# Patient Record
Sex: Female | Born: 1985 | Hispanic: Yes | Marital: Married | Smoking: Never smoker
Health system: Southern US, Community
[De-identification: ages and names within clinical notes are randomized; demographics above are authoritative.]

## PROBLEM LIST (undated history)

## (undated) HISTORY — PX: DILATION AND CURETTAGE OF UTERUS: SHX78

---

## 2012-11-26 ENCOUNTER — Ambulatory Visit: Payer: Self-pay | Admitting: Advanced Practice Midwife

## 2012-11-30 ENCOUNTER — Ambulatory Visit: Payer: Self-pay | Admitting: Obstetrics and Gynecology

## 2012-11-30 LAB — HEMOGLOBIN: HGB: 12 g/dL (ref 12.0–16.0)

## 2012-12-01 LAB — PATHOLOGY REPORT

## 2013-07-01 ENCOUNTER — Ambulatory Visit: Payer: Self-pay | Admitting: Advanced Practice Midwife

## 2013-11-20 ENCOUNTER — Observation Stay: Payer: Self-pay

## 2013-11-22 ENCOUNTER — Inpatient Hospital Stay: Payer: Self-pay | Admitting: Obstetrics and Gynecology

## 2013-11-22 LAB — CBC WITH DIFFERENTIAL/PLATELET
Basophil #: 0 10*3/uL (ref 0.0–0.1)
Basophil %: 0.5 %
EOS PCT: 0.4 %
Eosinophil #: 0 10*3/uL (ref 0.0–0.7)
HCT: 38.1 % (ref 35.0–47.0)
HGB: 12.6 g/dL (ref 12.0–16.0)
LYMPHS ABS: 1.3 10*3/uL (ref 1.0–3.6)
LYMPHS PCT: 16.2 %
MCH: 29 pg (ref 26.0–34.0)
MCHC: 33 g/dL (ref 32.0–36.0)
MCV: 88 fL (ref 80–100)
MONOS PCT: 8.4 %
Monocyte #: 0.7 x10 3/mm (ref 0.2–0.9)
NEUTROS ABS: 6.2 10*3/uL (ref 1.4–6.5)
Neutrophil %: 74.5 %
Platelet: 260 10*3/uL (ref 150–440)
RBC: 4.33 10*6/uL (ref 3.80–5.20)
RDW: 14.5 % (ref 11.5–14.5)
WBC: 8.3 10*3/uL (ref 3.6–11.0)

## 2013-11-22 LAB — GC/CHLAMYDIA PROBE AMP

## 2013-11-24 LAB — HEMATOCRIT: HCT: 28.2 % — ABNORMAL LOW (ref 35.0–47.0)

## 2014-04-13 IMAGING — US US OB LIMITED
1 series · 14 of 28 positions shown · non-contrast
Comparison: none

REASON FOR EXAM: CALL REPORT [id number] Fetal Heart Tones at 16.5 Wks
COMMENTS:

[Series 1: us ob limited · 0.30mm/px · 14 of 36 slices shown]
[im 2/36]
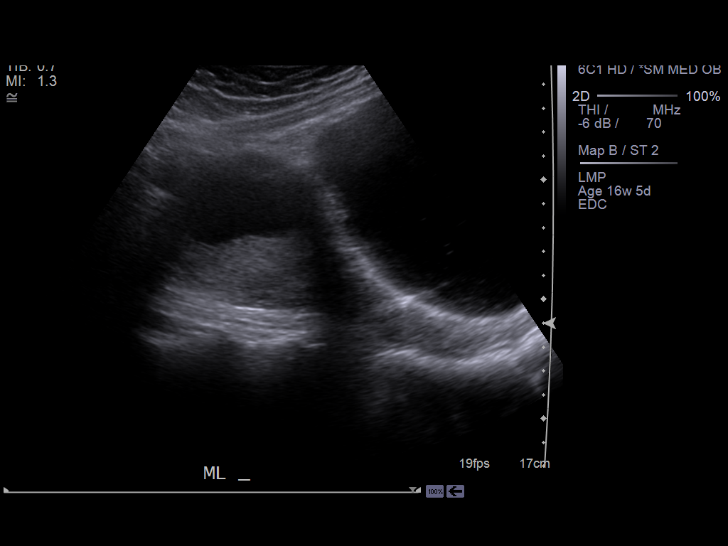
[im 4/36]
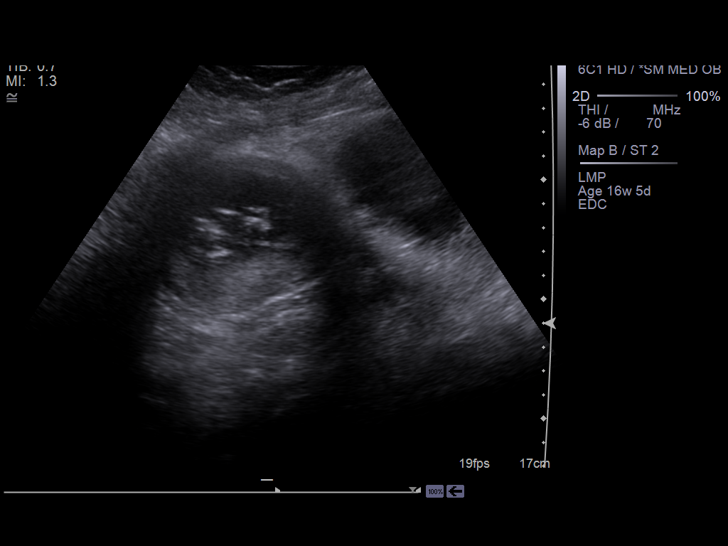
[im 7/36]
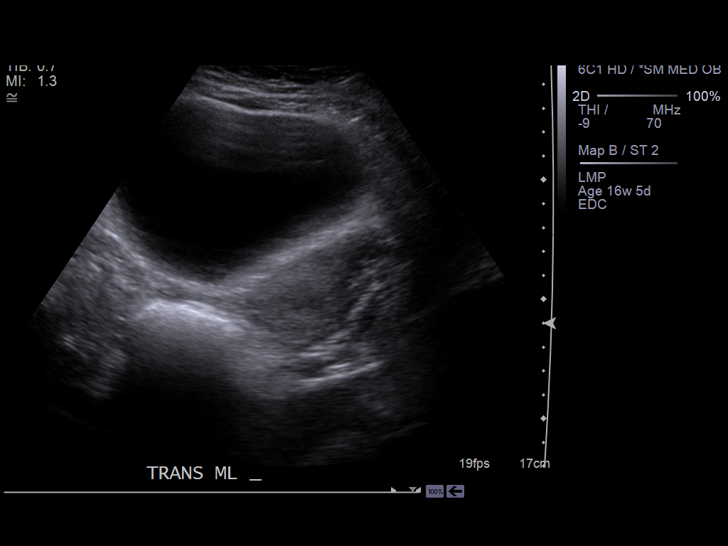
[im 10/36]
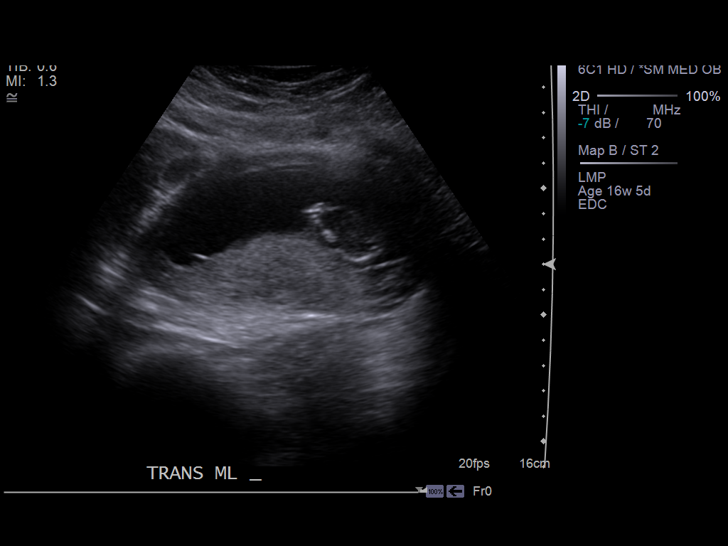
[im 12/36]
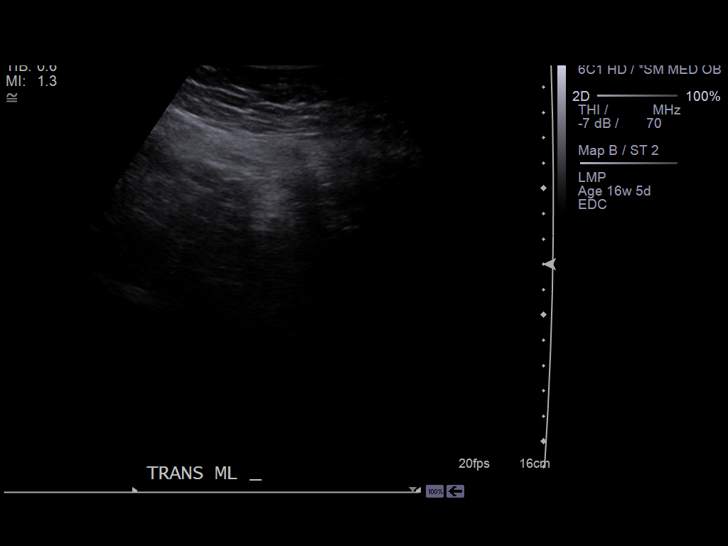
[im 15/36]
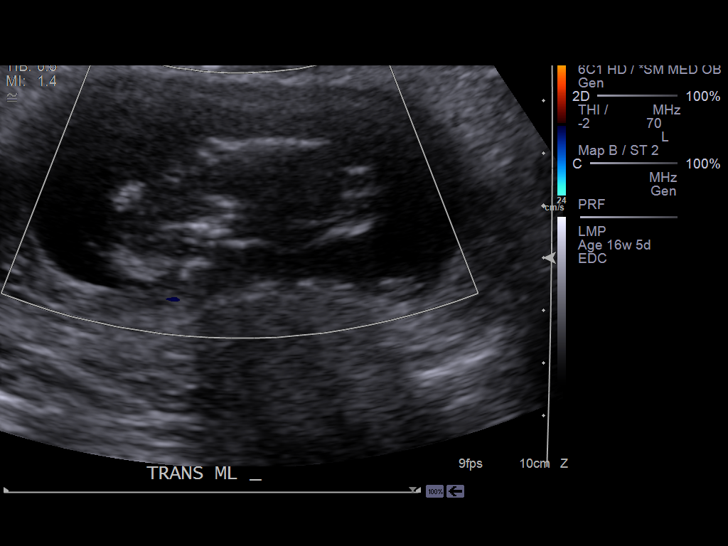
[im 17/36]
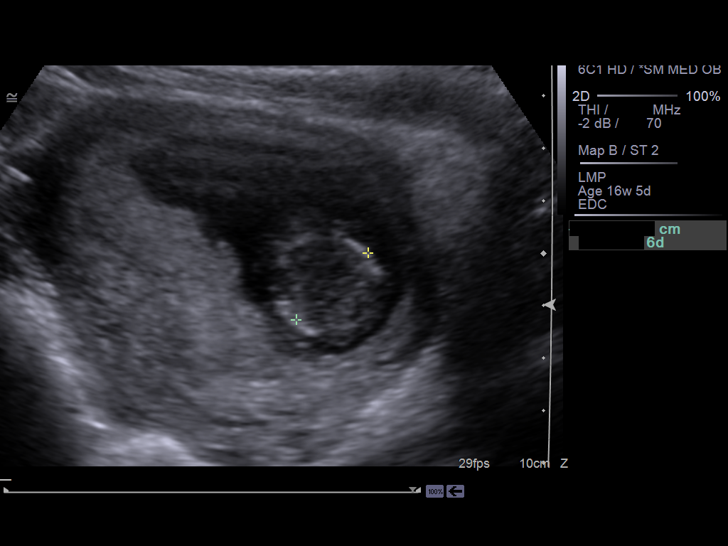
[im 20/36]
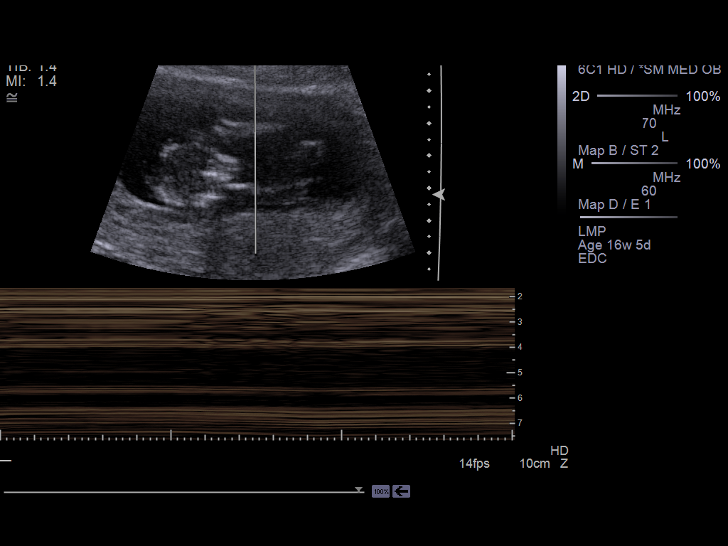
[im 23/36]
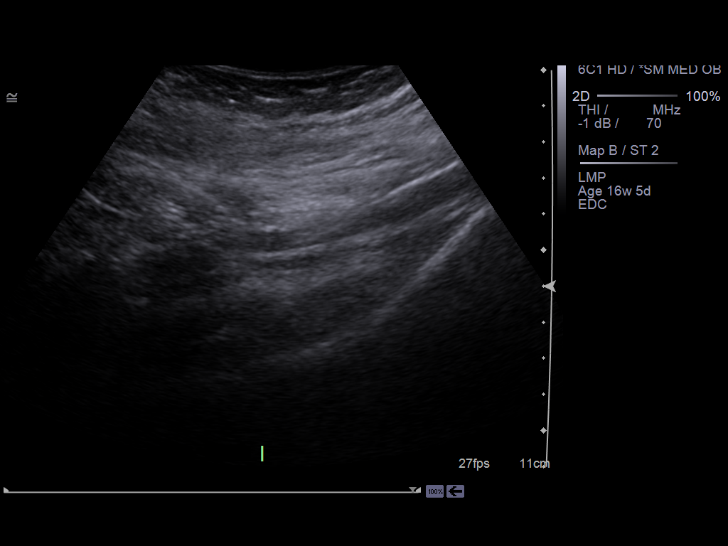
[im 25/36]
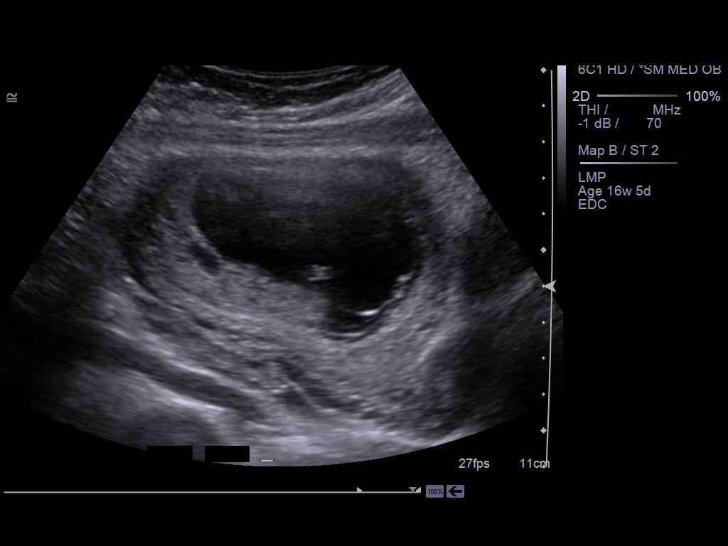
[im 28/36]
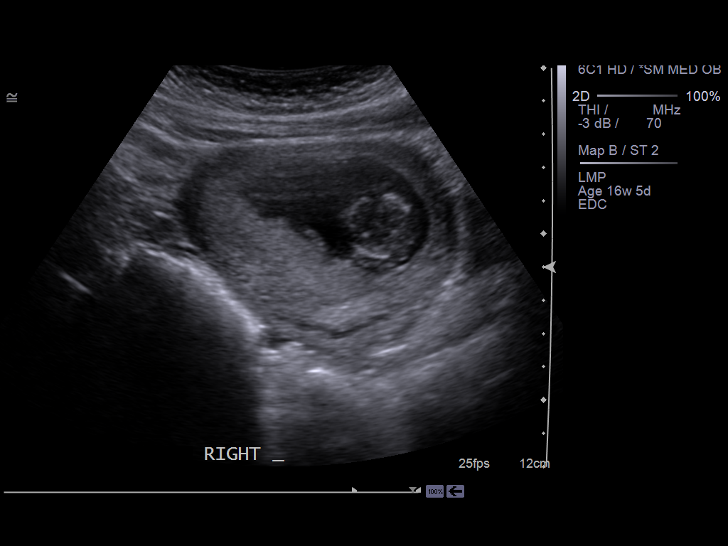
[im 30/36]
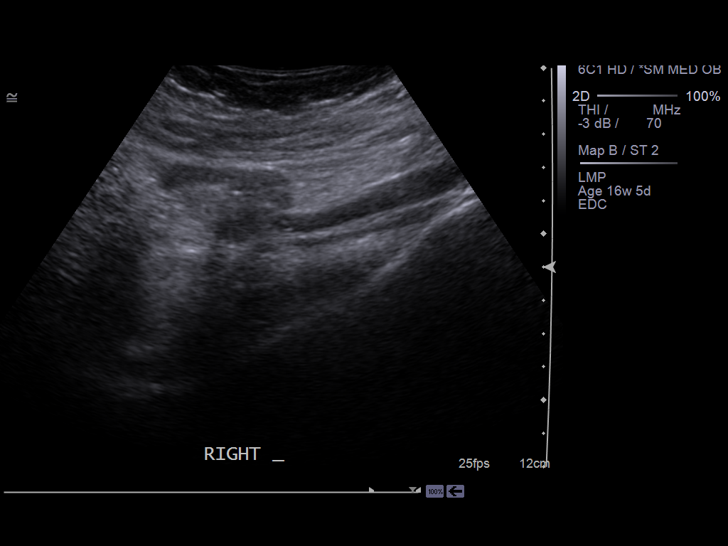
[im 33/36]
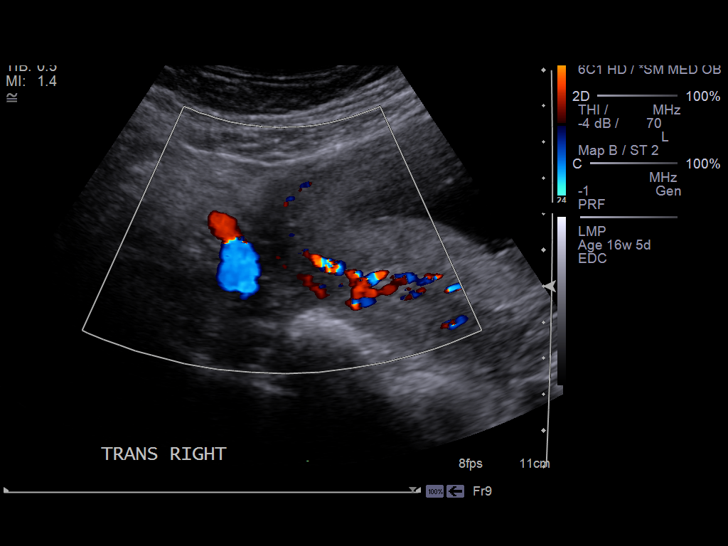
[im 36/36]
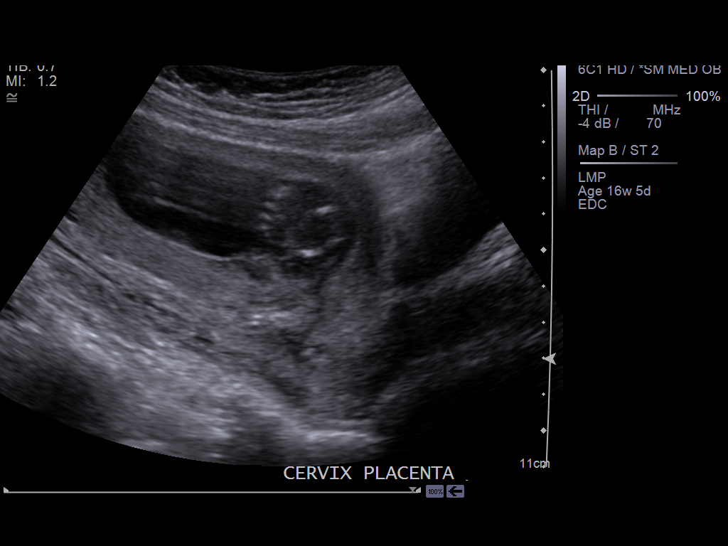

[14 of 28 positions shown; findings below may reference images not displayed]

PROCEDURE:     US  - US LIMITED OB  - November 26, 2012  [DATE]

RESULT:     There is a gravid uterus present. The presentation of the fetus
is currently transverse. No fetal cardiac activity is demonstrated. The
crown-rump length measures 5.52 cm corresponding to 12 week 1 day gestation
with the BPD measuring 1.7 cm which would correspond to 12 week 6 day
gestation.

The placenta is posterior, partially fundal, and the lower placental segment
comes to lie within 1 cm of the internal cervical os.
IMPRESSION: 1. The findings are consistent with fetal demise.
2. The lower placental segment is very low lying.

A preliminary report was called to Dr. Geulma at [DATE] p.m. on November 26, 2012.

[REDACTED]

## 2014-11-17 NOTE — Op Note (Signed)
PATIENT NAME:  Cheryl FickleCARMONA FRIAS, Phyillis MR#:  [id number] DATE OF BIRTH:  [birth year]  DATE OF PROCEDURE:  11/30/2012  PREOPERATIVE DIAGNOSIS: Missed abortion.   POSTOPERATIVE DIAGNOSIS: Missed abortion.   PROCEDURE: Suction dilation and curettage.   SURGEON: Jennell Cornerhomas Abryanna Musolino, M.D.   ANESTHESIA: General endotracheal anesthesia.   INDICATIONS: This is a 29 year old gravida 1, para 0 at 17 weeks with missed abortion documented by ultrasound on 11/26/2012.  Blood type O-positive.  DESCRIPTION OF PROCEDURE: After adequate general endotracheal anesthesia, the patient was placed in the dorsal supine position with the legs in the candy-cane stirrups. The patient was prepped and draped in normal sterile fashion. Bladder was catheterized yielding 300 mL of clear urine. A weighted speculum was placed in the posterior vaginal vault and the anterior cervix was grasped with a single-tooth tenaculum. The cervix was dilated to a #20 Hanks dilator without difficulty. A #8 flexible suction curette placed into the endometrial cavity. Large amount of tissue, blood and fluid removed. Randall stone forceps removed additional large portion of placental tissue. The patient had brisk bleeding during the procedure. She did receive 0.2 mg intramuscular injection of Methergine and 250 mcg of Hemabate intramuscularly as well. At the end of the procedure, the patient's bleeding was controlled.  No active bleeding noted. The patient's vital signs stayed stable throughout the procedure.  Intraoperative fluids 500 mL. Estimated blood loss 800 mL.  The patient was taken to the recovery room in good condition.  ____________________________ Suzy Bouchardhomas J. Jeanclaude Wentworth, MD tjs:sb D: 11/30/2012 10:45:30 ET     T: 11/30/2012 10:51:35 ET       JOB#: [id number] cc: Suzy Bouchardhomas J. Ericha Whittingham, MD, <Dictator> Suzy BouchardHOMAS J Aviannah Castoro MD ELECTRONICALLY SIGNED 12/13/2012 13:55

## 2014-11-18 NOTE — Op Note (Signed)
PATIENT NAME:  Cheryl, Fernandez MR#:  [id number] DATE OF BIRTH:  1985-11-12  DATE OF PROCEDURE:  11/23/2013  PREOPERATIVE DIAGNOSES:  43.  A 29 year old gravida 2, P0-0-1-0 at 40 weeks, 2 days gestation.  2.  Premature rupture of membranes.  3.  Active phase arrest.  4.  Chorioamnionitis.    POSTOPERATIVE DIAGNOSES: 29.  A 29 year old gravida 2, P0-o-1-0 at 40 weeks, 2 days gestation.  2.  Premature rupture of membranes.  3.  Active phase arrest.  4.  Chorioamnionitis.  5.  Meconium.   PROCEDURE PERFORMED: Primary low transverse cesarean section with Pfannenstiel skin incision.   ANESTHESIA USED: Epidural.  PRIMARY SURGEON: Lorrene Reid, M.D.   ASSISTANT: Deatra Robinson, MD.   PREOPERATIVE ANTIBIOTICS: The patient was on ampicillin and gentamicin secondary to the diagnosis of chorioamnionitis. She had 900 mg of clindamycin added to this regimen at the time of C-section.   ESTIMATED BLOOD LOSS: 600 mL.  OPERATIVE FLUIDS: 700 mL of crystalloid.   URINE OUTPUT: 125 mL of clear urine.   COMPLICATIONS: None.   FINDINGS: Normal tubes, ovaries and uterus. Delivery resulted in the birth of a liveborn female infant weighing 3390 grams, 7 pounds, 8 ounces, Apgars 7 and 8.   SPECIMENS REMOVED: None.   COMPLICATIONS: None.   THE PATIENT CONDITION FOLLOWING PROCEDURE: Stable.   PROCEDURE IN DETAIL: The risks, benefits, and alternatives of the procedure were discussed with the patient prior to proceeding to the operating room. The patient was taken to the operating room where epidural was dosed for a C-section. She was positioned in the supine position, prepped and draped in the usual sterile fashion. A timeout procedure was performed. A Pfannenstiel skin incision was made 2 cm above the pubic symphysis and carried down sharply to the level of the rectus fascia. The fascia was incised sharply using the scalpel and then extended using Mayo scissors. The superior border of the rectus  fascia was grasped with 2 Kocher clamps. The underlying rectus muscles were dissected off the fascia bluntly. The median raphe was incised using Mayo scissors. The inferior border of the rectus fascia was dissected off the rectus muscle in a similar fashion. The midline was identified. The peritoneum was entered bluntly and the peritoneal incision extended using manual traction. A bladder blade was then placed. A low transverse incision was made on the uterus and the hysterotomy incision was entered bluntly using the operator's finger. Hysterotomy incision was then extended using manual traction. Light meconium was noted upon entry into the uterus. The operator's hand was placed into the hysterotomy incision. The fetus was noted in the OA position. The vertex was grasped, flexed, brought to the incision and the infant was delivered atraumatically using fundal pressure. Cord was clamped and cut. The infant was then passed to the awaiting pediatricians.   Placenta was delivered using manual extraction. The uterus was exteriorized and wiped clean of clots and debris using moist laps. The hysterotomy incision was then closed using a 2 layer closure of 0 Vicryl with the first being a running locked, the second a horizontal imbricating. Following uterine closure, the uterus was returned to the abdomen. The peritoneal gutters were wiped clean of clots and debris using 2 moist laps. The hysterotomy incision was reinspected and noted to be hemostatic. The rectus muscles were reapproximated in the midline using a 2-0 Vicryl mattress stitch. Following this, the On-Q catheters were placed subfascially per the usual protocol. The fascia was closed using a looped #1  PDS in a running fashion. Subcutaneous tissue was irrigated and hemostasis achieved using the Bovie. The skin was closed using a 4-0 Monocryl in a subcuticular fashion. Each On-Q catheter was dressed with Dermabond as was the incision. Each On-Q catheter was bolused  with 5 mL of 0.5% bupivacaine each. Sponge, needle, and instrument counts were correct x 2. The patient tolerated the procedure well and was taken to the recovery room in stable condition.   ____________________________ Florina OuAndreas M. Bonney AidStaebler, MD ams:aw D: 11/23/2013 07:37:00 ET T: 11/23/2013 07:59:19 ET JOB#: [id number]  cc: Florina OuAndreas M. Bonney AidStaebler, MD, <Dictator> Lorrene ReidANDREAS M Aldous Housel MD ELECTRONICALLY SIGNED 12/04/2013 11:29

## 2014-12-03 ENCOUNTER — Emergency Department: Payer: Medicaid Other

## 2014-12-03 ENCOUNTER — Emergency Department
Admission: EM | Admit: 2014-12-03 | Discharge: 2014-12-03 | Disposition: A | Discharge status: Home / Self Care | Payer: Medicaid Other | Attending: Emergency Medicine | Admitting: Emergency Medicine

## 2014-12-03 ENCOUNTER — Encounter: Payer: Self-pay | Admitting: *Deleted

## 2014-12-03 DIAGNOSIS — Z3A08 8 weeks gestation of pregnancy: Secondary | ICD-10-CM | POA: Insufficient documentation

## 2014-12-03 DIAGNOSIS — O418X10 Other specified disorders of amniotic fluid and membranes, first trimester, not applicable or unspecified: Secondary | ICD-10-CM | POA: Diagnosis not present

## 2014-12-03 DIAGNOSIS — O468X1 Other antepartum hemorrhage, first trimester: Secondary | ICD-10-CM

## 2014-12-03 DIAGNOSIS — O9989 Other specified diseases and conditions complicating pregnancy, childbirth and the puerperium: Secondary | ICD-10-CM | POA: Diagnosis present

## 2014-12-03 DIAGNOSIS — Z79899 Other long term (current) drug therapy: Secondary | ICD-10-CM | POA: Diagnosis not present

## 2014-12-03 DIAGNOSIS — O26899 Other specified pregnancy related conditions, unspecified trimester: Secondary | ICD-10-CM

## 2014-12-03 DIAGNOSIS — O2341 Unspecified infection of urinary tract in pregnancy, first trimester: Secondary | ICD-10-CM | POA: Insufficient documentation

## 2014-12-03 DIAGNOSIS — R109 Unspecified abdominal pain: Secondary | ICD-10-CM

## 2014-12-03 LAB — COMPREHENSIVE METABOLIC PANEL
ALBUMIN: 3.9 g/dL (ref 3.5–5.0)
ALK PHOS: 70 U/L (ref 38–126)
ALT: 12 U/L — AB (ref 14–54)
AST: 15 U/L (ref 15–41)
Anion gap: 8 (ref 5–15)
BUN: 13 mg/dL (ref 6–20)
CALCIUM: 9.2 mg/dL (ref 8.9–10.3)
CO2: 22 mmol/L (ref 22–32)
Chloride: 107 mmol/L (ref 101–111)
Creatinine, Ser: 0.54 mg/dL (ref 0.44–1.00)
GFR calc non Af Amer: >60 mL/min (ref >60)
Glucose, Bld: 107 mg/dL — ABNORMAL HIGH (ref 65–99)
Potassium: 3.8 mmol/L (ref 3.5–5.1)
SODIUM: 137 mmol/L (ref 135–145)
TOTAL PROTEIN: 7.6 g/dL (ref 6.5–8.1)
Total Bilirubin: 0.3 mg/dL (ref 0.3–1.2)

## 2014-12-03 LAB — LIPASE, BLOOD: LIPASE: 31 U/L (ref 22–51)

## 2014-12-03 LAB — URINALYSIS COMPLETE WITH MICROSCOPIC (ARMC ONLY)
Bilirubin Urine: NEGATIVE
GLUCOSE, UA: NEGATIVE mg/dL
Ketones, ur: NEGATIVE mg/dL
NITRITE: NEGATIVE
Protein, ur: NEGATIVE mg/dL
SPECIFIC GRAVITY, URINE: 1.008 (ref 1.005–1.030)
pH: 7 (ref 5.0–8.0)

## 2014-12-03 LAB — CBC WITH DIFFERENTIAL/PLATELET
BASOS PCT: 0 %
Basophils Absolute: 0 10*3/uL (ref 0–0.1)
Eosinophils Absolute: 0.1 10*3/uL (ref 0–0.7)
Eosinophils Relative: 1 %
HEMATOCRIT: 38.2 % (ref 35.0–47.0)
HEMOGLOBIN: 12.8 g/dL (ref 12.0–16.0)
Lymphocytes Relative: 19 %
Lymphs Abs: 2.5 10*3/uL (ref 1.0–3.6)
MCH: 29.2 pg (ref 26.0–34.0)
MCHC: 33.6 g/dL (ref 32.0–36.0)
MCV: 86.9 fL (ref 80.0–100.0)
MONOS PCT: 6 %
Monocytes Absolute: 0.8 10*3/uL (ref 0.2–0.9)
NEUTROS ABS: 9.6 10*3/uL — AB (ref 1.4–6.5)
Neutrophils Relative %: 74 %
Platelets: 310 10*3/uL (ref 150–440)
RBC: 4.39 MIL/uL (ref 3.80–5.20)
RDW: 13.3 % (ref 11.5–14.5)
WBC: 13.1 10*3/uL — ABNORMAL HIGH (ref 3.6–11.0)

## 2014-12-03 LAB — HCG, QUANTITATIVE, PREGNANCY: HCG, BETA CHAIN, QUANT, S: 101107 m[IU]/mL — AB (ref <5)

## 2014-12-03 MED ORDER — CEPHALEXIN 500 MG PO CAPS
ORAL_CAPSULE | ORAL | Status: AC
  Administered 2014-12-03: 500 mg via ORAL
  Filled 2014-12-03: qty 1

## 2014-12-03 MED ORDER — ONDANSETRON 4 MG PO TBDP
4.0000 mg | ORAL_TABLET | Freq: Once | ORAL | Status: AC
  Administered 2014-12-03: 4 mg via ORAL

## 2014-12-03 MED ORDER — ONDANSETRON 4 MG PO TBDP
ORAL_TABLET | ORAL | Status: AC
  Administered 2014-12-03: 4 mg via ORAL
  Filled 2014-12-03: qty 1

## 2014-12-03 MED ORDER — CEPHALEXIN 500 MG PO CAPS
500.0000 mg | ORAL_CAPSULE | Freq: Once | ORAL | Status: AC
  Administered 2014-12-03: 500 mg via ORAL

## 2014-12-03 MED ORDER — CEPHALEXIN 500 MG PO CAPS: 500.0000 mg | ORAL_CAPSULE | Freq: Three times a day (TID) | ORAL | Status: AC

## 2014-12-03 NOTE — ED Notes (Signed)
Discharge completed and all questions answered. Pt aware of how to take meds and who to follow up with. Interpreter was at bedside.

## 2014-12-03 NOTE — Discharge Instructions (Signed)
Dolor abdominal durante el embarazo °(Abdominal Pain During Pregnancy) °El dolor de vientre (abdominal) es habitual durante el embarazo. Generalmente no se trata de un problema grave. Otras veces puede ser un signo de que algo no anda bien. Siempre comuníquese con su médico si tiene dolor abdominal. °CUIDADOS EN EL HOGAR °Controle el dolor para ver si hay cambios. Las indicaciones que siguen pueden ayudarla a sentirse mejor: °· Notenga sexo (relaciones sexuales) ni se coloque nada dentro de la vagina hasta que se sienta mejor. °· Haga reposo hasta que el dolor se calme. °· Si siente ganas de vomitar (náuseas ) beba líquidos claros. No consuma alimentos sólidos hasta que se sienta mejor. °· Sólo tome los medicamentos que le haya indicado su médico. °· Cumpla con las visitas al médico según las indicaciones. °SOLICITE AYUDA DE INMEDIATO SI:  °· Tiene un sangrado, pierde líquido o elimina trozos de tejido por la vagina. °· Siente más dolor o cólicos. °· Comienza a vomitar. °· Siente dolor al orinar u observa sangre en la orina. °· Tiene fiebre. °· No siente que el bebé se mueva mucho. °· Se siente muy débil o cree que va a desmayarse. °· Tiene dificultad para respirar con o sin dolor en el vientre. °· Siente un dolor de cabeza muy intenso y dolor en el vientre. °· Observa que sale un líquido por la vagina y tiene dolor abdominal. °· La materia fecal es líquida (diarrea). °· El dolor en el viente no desaparece, o empeora, luego de hacer reposo. °ASEGÚRESE DE QUE:  °· Comprende estas instrucciones. °· Controlará su afección. °· Recibirá ayuda de inmediato si no mejora o si empeora. °Document Released: 03/26/2011 Document Revised: 03/16/2013 °Exit[employer]® Patient Information ©2015 [employer]. This information is not intended to replace advice given to you by your health care provider. Make sure you discuss any questions you have with your health care provider. ° °

## 2014-12-03 NOTE — ED Notes (Addendum)
Awaiting interpreter for discharge.  

## 2014-12-03 NOTE — ED Notes (Signed)
Pt states she woke with new onset abdominal pain. Vomiting x 1. Pt denies dysuria. Pt denies vaginal bleeding or fluid leaking. Pt is [redacted] weeks pregnant.

## 2014-12-03 NOTE — ED Provider Notes (Addendum)
[facility] Emergency Department Provider Note  ____________________________________________  Time seen: Approximately 9:29 AM  I have reviewed the triage vital signs and the nursing notes.   HISTORY  Chief Complaint Abdominal Pain    HPI Oluchi Herma ArdCarmona Frias is a 29 y.o. female who is a G3 P1 presents today [redacted] weeks pregnant with lower abdominal pain. She says that this woke her up at 2 AM. It was across her lower abdomen and cramping. She says at this point the pain has been completely relieved. She is concerned because she had a miscarriage of her first pregnancy. She denies any vaginal bleeding. However, she was seen at the health department where she is getting her OB/GYN care this past Thursday and was put on a vaginal cream, presumably for bacterial vaginosis. Does report nausea and vomiting with pregnancy which is not new with this pain. No dysuria at this time.   History reviewed. No pertinent past medical history.  There are no active problems to display for this patient.   Past Surgical History  Procedure Laterality Date  . Cesarean section      Current Outpatient Rx  Name  Route  Sig  Dispense  Refill  . Prenatal Vit-Fe Fumarate-FA (PRENATAL MULTIVITAMIN) TABS tablet   Oral   Take 1 tablet by mouth at bedtime.            Allergies Review of patient's allergies indicates no known allergies.  No family history on file.  Social History History  Substance Use Topics  . Smoking status: Never Smoker   . Smokeless tobacco: Never Used  . Alcohol Use: No    Review of Systems Constitutional: No fever/chills Eyes: No visual changes. ENT: No sore throat. Cardiovascular: Denies chest pain. Respiratory: Denies shortness of breath. Gastrointestinal: No abdominal pain. Abdominal pain is resolved..    No diarrhea.  No constipation. Genitourinary: Negative for dysuria. Musculoskeletal: Negative for back pain. Skin: Negative for  rash. Neurological: Negative for headaches, focal weakness or numbness.  10-point ROS otherwise negative.  ____________________________________________   PHYSICAL EXAM:  VITAL SIGNS: ED Triage Vitals  Enc Vitals Group     BP 12/03/14 0244 130/72 mmHg     Pulse Rate 12/03/14 0244 90     Resp 12/03/14 0244 16     Temp 12/03/14 0244 97.8 F (36.6 C)     Temp Source 12/03/14 0244 Oral     SpO2 12/03/14 0244 100 %     Weight 12/03/14 0244 162 lb (73.483 kg)     Height 12/03/14 0244 5' 8.11" (1.73 m)     Head Cir --      Peak Flow --      Pain Score --      Pain Loc --      Pain Edu? --      Excl. in GC? --     Constitutional: Alert and oriented. Well appearing and in no acute distress. Eyes: Conjunctivae are normal. PERRL. EOMI. Head: Atraumatic. Nose: No congestion/rhinnorhea. Mouth/Throat: Mucous membranes are moist.  Oropharynx non-erythematous. Neck: No stridor.   Cardiovascular: Normal rate, regular rhythm. Grossly normal heart sounds.  Good peripheral circulation. Respiratory: Normal respiratory effort.  No retractions. Lungs CTAB. Gastrointestinal: Soft and nontender. No distention. No abdominal bruits. No CVA tenderness. Genitourinary:  Speculum exam deferred because of recent exam done this Thursday. Bimanual exam with closed cervix. No CMT or uterine or adnexal tenderness palpation.  Musculoskeletal: No lower extremity tenderness nor edema.  No joint effusions.  Neurologic:  Normal speech and language. No gross focal neurologic deficits are appreciated. Speech is normal. No gait instability. Skin:  Skin is warm, dry and intact. No rash noted. Psychiatric: Mood and affect are normal. Speech and behavior are normal.  ____________________________________________   LABS (all labs ordered are listed, but only abnormal results are displayed)  Labs Reviewed  CBC WITH DIFFERENTIAL/PLATELET - Abnormal; Notable for the following:    WBC 13.1 (*)    Neutro Abs 9.6 (*)     All other components within normal limits  COMPREHENSIVE METABOLIC PANEL - Abnormal; Notable for the following:    Glucose, Bld 107 (*)    ALT 12 (*)    All other components within normal limits  HCG, QUANTITATIVE, PREGNANCY - Abnormal; Notable for the following:    hCG, Beta Chain, Quant, S 101107 (*)    All other components within normal limits  URINALYSIS COMPLETEWITH MICROSCOPIC ([facility])  - Abnormal; Notable for the following:    Color, Urine STRAW (*)    APPearance HAZY (*)    Hgb urine dipstick 2+ (*)    Leukocytes, UA 3+ (*)    Bacteria, UA RARE (*)    Squamous Epithelial / LPF 0-5 (*)    All other components within normal limits  LIPASE, BLOOD   ____________________________________________  EKG   ____________________________________________  RADIOLOGY  Single live IUP on ultrasound with small subchorionic hemorrhage. ____________________________________________   PROCEDURES    ____________________________________________   INITIAL IMPRESSION / ASSESSMENT AND PLAN / ED COURSE  Pertinent labs & imaging results that were available during my care of the patient were reviewed by me and considered in my medical decision making (see chart for details).  We'll treat patient for UTI. Has follow-up with the health department. Patient reports that she is O+. Will not require any RhoGAM. Patient is taking prenatal vitamins at home and will continue this. We'll treat her for her UTI. Discussed the subchorionic hemorrhage with the patient and her husband. Patient knows to be aware that this may be an increasing bleed and may result in further bleeding and possible miscarriage or may resolve completely. ____________________________________________   FINAL CLINICAL IMPRESSION(S) / ED DIAGNOSES  Abdominal pain and pregnancy. Subchorionic hemorrhage. Urinary tract infection. Acute common initial visit.    Arelia Longestavid M Ayson Cherubini, MD 12/03/14 [phone]  Only white blood cell  count likely secondary to pregnancy. Unlikely appendicitis no abdominal pain on the time of exam.  Arelia Longestavid M Chery Giusto, MD 12/03/14 1011

## 2014-12-03 NOTE — ED Notes (Signed)
Patient transported to Ultrasound, by ultrasound tech.

## 2014-12-03 NOTE — ED Notes (Signed)
Pt dressed and sitting at bedside in nad. Family at bedside.

## 2014-12-03 NOTE — ED Notes (Signed)
Interpreter request entered

## 2014-12-03 NOTE — ED Notes (Signed)
Pt resting in bed in nad at this time. Pt has family at bedside. Pt awaits results and dispo. Pt free of needs or complainty at this time

## 2014-12-03 NOTE — ED Notes (Signed)
Patient is resting comfortably in bed waiting for ultrasound at this time.

## 2014-12-05 NOTE — H&P (Signed)
L&D Evaluation:  History:  HPI 29 yo G2P1001 with LMP of 02/14/13 & EDD of 11/21/13 from [facility] signficant for Tdap administration, LOF since Sunday with proven ROM today at [facility]. [place] [facility] [place] [facility] but, was neg for nitrazine and no fluid on a pad noted. Pt states she was leaking and sought eval here and was sent home and continued to leak clear fluid. Pt has a normal craving for ETOH but, none in pregnancy. No unitC,No VB, no cramping.   Presents with leaking fluid   Patient's Medical History Stress, Anxiety, UTI, spider veins, torn ligaments Rt arm,   Patient's Surgical History D&C, molar extraction   Medications Pre Natal Vitamins   Allergies NKDA   Social History EtOH  craves beer, none in pregnancy   ROS:  ROS All systems were reviewed.  HEENT, CNS, GI, GU, Respiratory, CV, Renal and Musculoskeletal systems were found to be normal.   Exam:  Vital Signs stable   General no apparent distress   Mental Status clear   Chest clear   Heart normal sinus rhythm, no murmur/gallop/rubs   Abdomen gravid, non-tender   Estimated Fetal Weight Average for gestational age   Fetal Position vtx   Back no CVAT   Reflexes 1+   Clonus negative   Pelvic 1.5/80%/vtx-1   Mebranes Intact   FHT normal rate with no decels   Ucx irregular   Skin dry   Lymph no lymphadenopathy   Impression:  Impression IUP at 40 1/7 weeks with SROM   Plan:  Plan monitor contractions and for cervical change, Pitocin per protocol   Comments Disc with Interpreter the risks, benefits and alternatives of Pitocin and IOL including fetal distress and uterine distress. Risks of infection, bleeding and the need to monitor for fetal and uterine complications. Blood consent signed and pt understood consent.   Electronic Signatures: Sharee PimpleJones, Caron W (CNM)  (Signed 28-Apr-15 13:00)  Authored: L&D Evaluation   Last Updated: 28-Apr-15 13:00 by Sharee PimpleJones, Caron W (CNM)

## 2015-06-27 NOTE — H&P (Signed)
OB DELIVERY PLAN VISIT  LMP: March 4 EDD: Dec 7   Subjective:    Cheryl Fernandez is a 29 y.o. female 63P1011 @ 39wks who presents for repeat c/s.    APC: [facility] 1. Previous C/S, desires repeat - 10/2013 @ 40+2 [facility] after failed IOL for PROM - FTP/Fetal distress/Chorio - s/p tdap 04/10/15 - s/p flu 11/30/14  Labs: 5.2016 - O+, Hbsag neg, h/h 12.5/37.2<317, GC/CT neg, HIV neg, Ab neg 9.14.16 - GCT 118, RPR NR, HIV neg, hb 11.5 2014 - RI, VZV immune  Sonos: per patient normal 19wk anatomy sono normal  Gynecologic History Patient's last menstrual period was 09/29/2014. Contraception prior to pregnancy: abstinence and natural family planning (NFP) Sexually active: yes Desires STD screening: no No h/o STDs Last Pap: 2014. Results were: normal   Obstetric History                      OB History  Gravida Para Term Preterm AB SAB TAB Ectopic Multiple Living  3 1 1  1 1    1     # Outcome Date GA Lbr Len/2nd Weight Sex Delivery Anes PTL Lv  3 Current           2 Term 11/23/13   3.402 kg (7 lb 8 oz) F CS-LTranv EPI  Y  Complications: Chorioamnionitis  1 SAB 11/30/12            s/p D&C after SAB  Past Medical History:  has no past medical history on file. None Problem List:  does not have a problem list on file. Past Surgical History:  has a past surgical history that includes Dilation and curettage of uterus (11/30/2012) and Cesarean section (11/23/2013). Family History: family history includes Diabetes mellitus in her mother; Hypertension in her father and mother. Social History:  reports that she has never smoked. She does not have any smokeless tobacco history on file. She reports that she does not drink alcohol or use illicit drugs. Current Medications: has a current medication list which includes the following prescription(s): prenatal vit no.124-iron-fa. Prior to encounter Medications:        Current Outpatient Prescriptions on  File Prior to Visit  Medication Sig Dispense Refill  . prenatal vit no.124-iron-FA (PRENATAL VITAMIN) 27 mg iron- 800 mcg Tab Take 1 tablet by mouth once daily.     No current facility-administered medications on file prior to visit.    Allergies: is allergic to tetanus vaccines and toxoid.  Review of Systems 14 systems reviewed pertinent positives and negatives as noted in the HPI and below.   Objective:       Vitals:   05/17/15 1020  BP: 110/78   General appearance: alert, appears stated age and cooperative Head: Normocephalic, without obvious abnormality, atraumatic Eyes: conjunctivae/corneas clear. PERRL, EOM's intact. Fundi benign. Sclera anicteric. Abdomen: soft, non-tender; bowel sounds normal; no masses, no organomegaly; pfannensteil incision Extremities: extremities normal, atraumatic, no cyanosis or edema Skin: Skin color, texture, turgor normal. No rashes or lesions Lymph nodes: Inguinal adenopathy: none    Assessment:    29 y.o. female G3P1011 @ 39wks here for repeat C/s.  Plan:    1. Previous C/S, plan for repeat - r/b/a of cesarean section discussed, including bleeding/pain/infection, injury to surrounding organs, wound separation, and need for blood transfusion. All questions answered. Consent forms signed for blood transfusion and repeat cesarean section.  2. Contraception - patient will think about forms of contraception but does not want a  BTL  Burnett Corrente, MD

## 2015-06-28 ENCOUNTER — Encounter
Admission: RE | Admit: 2015-06-28 | Discharge: 2015-06-28 | Disposition: A | Discharge status: Home / Self Care | Payer: Medicaid Other | Source: Ambulatory Visit | Attending: Obstetrics and Gynecology | Admitting: Obstetrics and Gynecology

## 2015-06-28 LAB — TYPE AND SCREEN
ABO/RH(D): O POS
Antibody Screen: NEGATIVE
Extend sample reason: UNDETERMINED

## 2015-06-28 LAB — CBC
HCT: 35.1 % (ref 35.0–47.0)
Hemoglobin: 11.6 g/dL — ABNORMAL LOW (ref 12.0–16.0)
MCH: 27.3 pg (ref 26.0–34.0)
MCHC: 33.2 g/dL (ref 32.0–36.0)
MCV: 82.1 fL (ref 80.0–100.0)
PLATELETS: 264 10*3/uL (ref 150–440)
RBC: 4.27 MIL/uL (ref 3.80–5.20)
RDW: 14.8 % — AB (ref 11.5–14.5)
WBC: 8.7 10*3/uL (ref 3.6–11.0)

## 2015-06-28 LAB — ABO/RH: ABO/RH(D): O POS

## 2015-06-29 ENCOUNTER — Inpatient Hospital Stay: Payer: Medicaid Other | Admitting: Certified Registered Nurse Anesthetist

## 2015-06-29 ENCOUNTER — Inpatient Hospital Stay
Admission: RE | Admit: 2015-06-29 | Discharge: 2015-07-01 | DRG: 765 | Disposition: A | Discharge status: Home / Self Care | Payer: Medicaid Other | Source: Ambulatory Visit | Attending: Obstetrics and Gynecology | Admitting: Obstetrics and Gynecology

## 2015-06-29 ENCOUNTER — Encounter
Admission: RE | Disposition: A | Discharge status: Home / Self Care | Payer: Self-pay | Source: Ambulatory Visit | Attending: Obstetrics and Gynecology

## 2015-06-29 DIAGNOSIS — Z8249 Family history of ischemic heart disease and other diseases of the circulatory system: Secondary | ICD-10-CM

## 2015-06-29 DIAGNOSIS — O34219 Maternal care for unspecified type scar from previous cesarean delivery: Principal | ICD-10-CM | POA: Diagnosis present

## 2015-06-29 DIAGNOSIS — Z3A39 39 weeks gestation of pregnancy: Secondary | ICD-10-CM

## 2015-06-29 DIAGNOSIS — D62 Acute posthemorrhagic anemia: Secondary | ICD-10-CM | POA: Diagnosis present

## 2015-06-29 DIAGNOSIS — Z833 Family history of diabetes mellitus: Secondary | ICD-10-CM

## 2015-06-29 DIAGNOSIS — Z887 Allergy status to serum and vaccine status: Secondary | ICD-10-CM

## 2015-06-29 DIAGNOSIS — Z98891 History of uterine scar from previous surgery: Secondary | ICD-10-CM

## 2015-06-29 LAB — RPR: RPR: NONREACTIVE

## 2015-06-29 SURGERY — Surgical Case
Anesthesia: Spinal

## 2015-06-29 MED ORDER — ACETAMINOPHEN 500 MG PO TABS: 1000.0000 mg | ORAL_TABLET | Freq: Four times a day (QID) | ORAL | Status: AC

## 2015-06-29 MED ORDER — DEXAMETHASONE SODIUM PHOSPHATE 10 MG/ML IJ SOLN
INTRAMUSCULAR | Status: DC | PRN
  Administered 2015-06-29: 5 mg via INTRAVENOUS

## 2015-06-29 MED ORDER — LANOLIN HYDROUS EX OINT: 1.0000 "application " | TOPICAL_OINTMENT | CUTANEOUS | Status: DC | PRN

## 2015-06-29 MED ORDER — CITRIC ACID-SODIUM CITRATE 334-500 MG/5ML PO SOLN
ORAL | Status: AC
  Filled 2015-06-29: qty 15

## 2015-06-29 MED ORDER — LACTATED RINGERS IV SOLN: Freq: Once | INTRAVENOUS | Status: DC

## 2015-06-29 MED ORDER — OXYTOCIN 40 UNITS IN LACTATED RINGERS INFUSION - SIMPLE MED
INTRAVENOUS | Status: AC
  Administered 2015-06-29: 250 mL via INTRAVENOUS
  Filled 2015-06-29: qty 1000

## 2015-06-29 MED ORDER — NALBUPHINE HCL 10 MG/ML IJ SOLN
5.0000 mg | INTRAMUSCULAR | Status: DC | PRN
  Filled 2015-06-29: qty 0.5

## 2015-06-29 MED ORDER — KETOROLAC TROMETHAMINE 30 MG/ML IJ SOLN: 30.0000 mg | Freq: Four times a day (QID) | INTRAMUSCULAR | Status: AC | PRN

## 2015-06-29 MED ORDER — WITCH HAZEL-GLYCERIN EX PADS: 1.0000 "application " | MEDICATED_PAD | CUTANEOUS | Status: DC | PRN

## 2015-06-29 MED ORDER — SODIUM CHLORIDE 0.9 % IJ SOLN: 3.0000 mL | INTRAMUSCULAR | Status: DC | PRN

## 2015-06-29 MED ORDER — ONDANSETRON HCL 4 MG/2ML IJ SOLN
4.0000 mg | Freq: Three times a day (TID) | INTRAMUSCULAR | Status: DC | PRN
Start: 2015-06-29 — End: 2015-07-01
  Administered 2015-06-29: 4 mg via INTRAVENOUS

## 2015-06-29 MED ORDER — NALOXONE HCL 2 MG/2ML IJ SOSY: 1.0000 ug/kg/h | PREFILLED_SYRINGE | INTRAMUSCULAR | Status: DC | PRN

## 2015-06-29 MED ORDER — DIPHENHYDRAMINE HCL 50 MG/ML IJ SOLN: 12.5000 mg | INTRAMUSCULAR | Status: DC | PRN

## 2015-06-29 MED ORDER — SIMETHICONE 80 MG PO CHEW: 80.0000 mg | CHEWABLE_TABLET | ORAL | Status: DC

## 2015-06-29 MED ORDER — KETOROLAC TROMETHAMINE 30 MG/ML IJ SOLN
30.0000 mg | Freq: Four times a day (QID) | INTRAMUSCULAR | Status: AC | PRN
  Administered 2015-06-29 (×2): 30 mg via INTRAVENOUS
  Filled 2015-06-29: qty 1

## 2015-06-29 MED ORDER — OXYCODONE-ACETAMINOPHEN 5-325 MG PO TABS: 1.0000 | ORAL_TABLET | ORAL | Status: DC | PRN

## 2015-06-29 MED ORDER — BUPIVACAINE IN DEXTROSE 0.75-8.25 % IT SOLN
INTRATHECAL | Status: DC | PRN
  Administered 2015-06-29: 1.6 mL via INTRATHECAL

## 2015-06-29 MED ORDER — DIBUCAINE 1 % RE OINT: 1.0000 "application " | TOPICAL_OINTMENT | RECTAL | Status: DC | PRN

## 2015-06-29 MED ORDER — LACTATED RINGERS IV SOLN
INTRAVENOUS | Status: DC
  Administered 2015-06-29: 999 mL/h via INTRAVENOUS

## 2015-06-29 MED ORDER — PHENYLEPHRINE HCL 10 MG/ML IJ SOLN
INTRAMUSCULAR | Status: DC | PRN
  Administered 2015-06-29: 30 ug via INTRAVENOUS
  Administered 2015-06-29: 200 ug via INTRAVENOUS
  Administered 2015-06-29 (×6): 30 ug via INTRAVENOUS
  Administered 2015-06-29: 10 ug via INTRAVENOUS

## 2015-06-29 MED ORDER — CEFAZOLIN SODIUM-DEXTROSE 2-3 GM-% IV SOLR
2.0000 g | INTRAVENOUS | Status: AC
  Administered 2015-06-29: 2 g via INTRAVENOUS
  Filled 2015-06-29: qty 50

## 2015-06-29 MED ORDER — PRENATAL MULTIVITAMIN CH
1.0000 | ORAL_TABLET | Freq: Every day | ORAL | Status: DC
  Administered 2015-06-30 – 2015-07-01 (×2): 1 via ORAL
  Filled 2015-06-29 (×2): qty 1

## 2015-06-29 MED ORDER — MORPHINE SULFATE (PF) 0.5 MG/ML IJ SOLN
INTRAMUSCULAR | Status: DC | PRN
  Administered 2015-06-29: .01 mg via EPIDURAL

## 2015-06-29 MED ORDER — SENNOSIDES-DOCUSATE SODIUM 8.6-50 MG PO TABS: 2.0000 | ORAL_TABLET | ORAL | Status: DC

## 2015-06-29 MED ORDER — NALOXONE HCL 2 MG/2ML IJ SOSY
PREFILLED_SYRINGE | INTRAMUSCULAR | Status: AC
  Filled 2015-06-29: qty 2

## 2015-06-29 MED ORDER — NALBUPHINE HCL 10 MG/ML IJ SOLN
5.0000 mg | Freq: Once | INTRAMUSCULAR | Status: DC | PRN
  Filled 2015-06-29: qty 0.5

## 2015-06-29 MED ORDER — TETANUS-DIPHTH-ACELL PERTUSSIS 5-2.5-18.5 LF-MCG/0.5 IM SUSP: 0.5000 mL | Freq: Once | INTRAMUSCULAR | Status: DC

## 2015-06-29 MED ORDER — OXYTOCIN 40 UNITS IN LACTATED RINGERS INFUSION - SIMPLE MED
62.5000 mL/h | INTRAVENOUS | Status: AC
  Administered 2015-06-29: 62.5 mL/h via INTRAVENOUS

## 2015-06-29 MED ORDER — DIPHENHYDRAMINE HCL 25 MG PO CAPS: 25.0000 mg | ORAL_CAPSULE | ORAL | Status: DC | PRN

## 2015-06-29 MED ORDER — EPHEDRINE SULFATE 50 MG/ML IJ SOLN
INTRAMUSCULAR | Status: DC | PRN
  Administered 2015-06-29: 10 mg via INTRAVENOUS

## 2015-06-29 MED ORDER — LACTATED RINGERS IV SOLN
INTRAVENOUS | Status: DC
  Administered 2015-06-29: 08:00:00 via INTRAVENOUS

## 2015-06-29 MED ORDER — LACTATED RINGERS IV SOLN
INTRAVENOUS | Status: DC
  Administered 2015-06-30: 01:00:00 via INTRAVENOUS

## 2015-06-29 MED ORDER — SIMETHICONE 80 MG PO CHEW
80.0000 mg | CHEWABLE_TABLET | ORAL | Status: DC | PRN
Start: 2015-06-29 — End: 2015-07-01

## 2015-06-29 MED ORDER — SIMETHICONE 80 MG PO CHEW
80.0000 mg | CHEWABLE_TABLET | Freq: Three times a day (TID) | ORAL | Status: DC
  Administered 2015-06-29 – 2015-07-01 (×6): 80 mg via ORAL
  Filled 2015-06-29 (×7): qty 1

## 2015-06-29 MED ORDER — CITRIC ACID-SODIUM CITRATE 334-500 MG/5ML PO SOLN
30.0000 mL | ORAL | Status: AC
  Administered 2015-06-29: 30 mL via ORAL

## 2015-06-29 MED ORDER — NALOXONE HCL 0.4 MG/ML IJ SOLN: 0.4000 mg | INTRAMUSCULAR | Status: DC | PRN

## 2015-06-29 MED ORDER — FENTANYL CITRATE (PF) 100 MCG/2ML IJ SOLN
INTRAMUSCULAR | Status: DC | PRN
  Administered 2015-06-29: 15 ug via INTRAVENOUS

## 2015-06-29 MED ORDER — OXYCODONE-ACETAMINOPHEN 5-325 MG PO TABS: 2.0000 | ORAL_TABLET | ORAL | Status: DC | PRN

## 2015-06-29 MED ORDER — OXYTOCIN 40 UNITS IN LACTATED RINGERS INFUSION - SIMPLE MED
INTRAVENOUS | Status: AC
  Administered 2015-06-29: 62.5 mL/h via INTRAVENOUS
  Filled 2015-06-29: qty 1000

## 2015-06-29 MED ORDER — IBUPROFEN 600 MG PO TABS
600.0000 mg | ORAL_TABLET | Freq: Four times a day (QID) | ORAL | Status: DC
  Administered 2015-06-29 – 2015-07-01 (×6): 600 mg via ORAL
  Filled 2015-06-29 (×8): qty 1

## 2015-06-29 MED ORDER — MENTHOL 3 MG MT LOZG: 1.0000 | LOZENGE | OROMUCOSAL | Status: DC | PRN

## 2015-06-29 MED ORDER — ZOLPIDEM TARTRATE 5 MG PO TABS: 5.0000 mg | ORAL_TABLET | Freq: Every evening | ORAL | Status: DC | PRN

## 2015-06-29 MED ORDER — NALBUPHINE HCL 10 MG/ML IJ SOLN
INTRAMUSCULAR | Status: AC
  Administered 2015-06-29: 10 mg via INTRAVENOUS
  Filled 2015-06-29: qty 1

## 2015-06-29 MED ORDER — ACETAMINOPHEN 325 MG PO TABS
650.0000 mg | ORAL_TABLET | ORAL | Status: DC | PRN
Start: 2015-06-29 — End: 2015-07-01
  Administered 2015-06-30: 650 mg via ORAL
  Filled 2015-06-29: qty 2

## 2015-06-29 SURGICAL SUPPLY — 18 items
CANISTER SUCT 3000ML (MISCELLANEOUS) ×3 IMPLANT
CHLORAPREP W/TINT 26ML (MISCELLANEOUS) ×3 IMPLANT
CLOSURE WOUND 1/2 X4 (GAUZE/BANDAGES/DRESSINGS) ×1
DRSG TELFA 3X8 NADH (GAUZE/BANDAGES/DRESSINGS) ×3 IMPLANT
GAUZE SPONGE 4X4 12PLY STRL (GAUZE/BANDAGES/DRESSINGS) ×3 IMPLANT
GOWN STRL REUS W/ TWL LRG LVL3 (GOWN DISPOSABLE) ×3 IMPLANT
GOWN STRL REUS W/TWL LRG LVL3 (GOWN DISPOSABLE) ×6
NS IRRIG 1000ML POUR BTL (IV SOLUTION) ×3 IMPLANT
PAD GROUND ADULT SPLIT (MISCELLANEOUS) ×3 IMPLANT
PAD OB MATERNITY 4.3X12.25 (PERSONAL CARE ITEMS) ×3 IMPLANT
PAD PREP 24X41 OB/GYN DISP (PERSONAL CARE ITEMS) ×3 IMPLANT
STRIP CLOSURE SKIN 1/2X4 (GAUZE/BANDAGES/DRESSINGS) ×2 IMPLANT
SUT MNCRL 4-0 (SUTURE) ×2
SUT MNCRL 4-0 27XMFL (SUTURE) ×1
SUT PLAIN 2 0 XLH (SUTURE) ×3 IMPLANT
SUT VIC AB 0 CT1 36 (SUTURE) ×9 IMPLANT
SUTURE MNCRL 4-0 27XMF (SUTURE) ×1 IMPLANT
SWABSTK COMLB BENZOIN TINCTURE (MISCELLANEOUS) ×3 IMPLANT

## 2015-06-29 NOTE — Op Note (Signed)
  Cesarean Section Procedure Note  Date of procedure: 06/29/2015   Pre-operative Diagnosis: Intrauterine pregnancy at 529w0d; Repeat C/S  Post-operative Diagnosis: same, delivered.  Procedure: Low Transverse Cesarean Section through Pfannenstiel incision  Surgeon: Burnett CorrenteJohanna Halfon, MD  Assistant(s):  Milon Scorearon Jones, CNM  Anesthesia: Spinal anesthesia  Estimated Blood Loss:  500         Drains: none         Total IV Fluids: 1000ml  Urine Output: 75ml         Specimens: none         Complications:  None; patient tolerated the procedure well.         Disposition: PACU - hemodynamically stable.         Condition: stable  Findings:  A female infant in ROA presentation. Amniotic fluid - Clear  Birth weight 3610 g.  Apgars of 9 and 9 at one and five minutes respectively.  Intact placenta with a three-vessel cord.  Grossly normal uterus, tubes and ovaries bilaterally. Minimal intraabdominal adhesions were noted.  Indications: Previous C/S  Procedure Details  The patient was taken to Operating Room, identified as the correct patient and the procedure verified as C-Section Delivery. A formal Time Out was held with all team members present and in agreement.  After induction of anesthesia, the patient was draped and prepped in the usual sterile manner. A Pfannenstiel skin incision was made and carried down through the subcutaneous tissue to the fascia. Fascial incision was made and extended transversely with the Mayo scissors. The fascia was separated from the underlying rectus tissue superiorly and inferiorly. The peritoneum was identified and entered bluntly. Peritoneal incision was extended longitudinally. The utero-vesical peritoneal reflection was incised transversely and a bladder flap was created digitally.   A low transverse hysterotomy was made. The fetus was delivered atraumatically. The umbilical cord was clamped x2 and cut and the infant was handed to the awaiting pediatricians.  The placenta was removed intact and appeared normal, intact, and with a 3-vessel cord.   The uterus was exteriorized and cleared of all clot and debris. The hysterotomy was closed with running sutures of 0-Vicryl. A second imbricating layer was placed with the same suture. Excellent hemostasis was observed. The peritoneal cavity was cleared of all clots and debris. The uterus was returned to the abdomen.   The pelvis was irrigated and again, excellent hemostasis was noted. The fascia was then reapproximated with running sutures of 0 Vicryl.  The subcutaneous tissue was reapproximated with interrupted sutures of 2-0 plain. The skin was reapproximated with a 4-0 Monocryl subcuticular stitch.  Instrument, sponge, and needle counts were correct prior to the abdominal closure and at the conclusion of the case.   The patient tolerated the procedure well and was transferred to the recovery room in stable condition.   Ala DachJohanna K Halfon, MD 06/29/2015

## 2015-06-29 NOTE — Anesthesia Procedure Notes (Signed)
Spinal Patient location during procedure: OR Start time: 06/29/2015 7:50 AM End time: 06/29/2015 7:54 AM Staffing Anesthesiologist: Rosaria FerriesPISCITELLO, JOSEPH K Other anesthesia staff: Ginger CarneMICHELET, Merel Santoli Performed by: resident/CRNA  Preanesthetic Checklist Completed: patient identified, site marked, surgical consent, pre-op evaluation, timeout performed, IV checked, risks and benefits discussed and monitors and equipment checked Spinal Block Patient position: sitting Prep: ChloraPrep Patient monitoring: continuous pulse ox and blood pressure Approach: midline Location: L3-4 Injection technique: single-shot Needle Needle type: Whitacre  Needle gauge: 25 G Assessment Sensory level: T4

## 2015-06-29 NOTE — Transfer of Care (Signed)
Immediate Anesthesia Transfer of Care Note  Patient: Cheryl Fernandez  Procedure(s) Performed: Procedure(s): CESAREAN SECTION (N/A)  Patient Location: PACU  Anesthesia Type:Spinal  Level of Consciousness: awake, alert  and oriented  Airway & Oxygen Therapy: Patient Spontanous Breathing  Post-op Assessment: Report given to RN and Post -op Vital signs reviewed and stable  Post vital signs: Reviewed and stable  Last Vitals:  Filed Vitals:   06/29/15 0731 06/29/15 0908  BP: 123/82 113/66  Pulse: 93 73  Temp: 36.5 C 36.3 C  Resp: 18 16    Complications: No apparent anesthesia complications

## 2015-06-29 NOTE — Interval H&P Note (Signed)
History and Physical Interval Note:  06/29/2015 7:24 AM  Cheryl Fernandez  has presented today for surgery, with the diagnosis of REPEAT Cesarean section.  The various methods of treatment have been discussed with the patient and family. After consideration of risks, benefits and other options for treatment, the patient has consented to  Procedure(s): CESAREAN SECTION (N/A) as a surgical intervention .  The patient's history has been reviewed, patient examined, no change in status, stable for surgery.  I have reviewed the patient's chart and labs.  Questions were answered to the patient's satisfaction.     Leonette MostJohanna K Halfon

## 2015-06-29 NOTE — Anesthesia Preprocedure Evaluation (Signed)
Anesthesia Evaluation  Patient identified by MRN, date of birth, ID band Patient awake    Reviewed: Allergy & Precautions, H&P , NPO status , Patient's Chart, lab work & pertinent test results  History of Anesthesia Complications Negative for: history of anesthetic complications  Airway Mallampati: II  TM Distance: >3 FB Neck ROM: full    Dental no notable dental hx. (+) Teeth Intact   Pulmonary neg pulmonary ROS,    Pulmonary exam normal breath sounds clear to auscultation       Cardiovascular Exercise Tolerance: Good (-) hypertensionnegative cardio ROS Normal cardiovascular exam Rhythm:regular Rate:Normal     Neuro/Psych negative neurological ROS  negative psych ROS   GI/Hepatic negative GI ROS, Neg liver ROS,   Endo/Other  negative endocrine ROS  Renal/GU negative Renal ROS  negative genitourinary   Musculoskeletal   Abdominal   Peds  Hematology negative hematology ROS (+)   Anesthesia Other Findings History reviewed. No pertinent past medical history.  Past Surgical History:   CESAREAN SECTION                                              DILATION AND CURETTAGE OF UTERUS                                Reproductive/Obstetrics (+) Pregnancy                             Anesthesia Physical Anesthesia Plan  ASA: II  Anesthesia Plan: Spinal   Post-op Pain Management:    Induction:   Airway Management Planned:   Additional Equipment:   Intra-op Plan:   Post-operative Plan:   Informed Consent: I have reviewed the patients History and Physical, chart, labs and discussed the procedure including the risks, benefits and alternatives for the proposed anesthesia with the patient or authorized representative who has indicated his/her understanding and acceptance.   Dental Advisory Given  Plan Discussed with: Anesthesiologist, CRNA and Surgeon  Anesthesia Plan Comments:  (Patient consented via interpreter.  )        Anesthesia Quick Evaluation

## 2015-06-30 LAB — CBC
HCT: 30.8 % — ABNORMAL LOW (ref 35.0–47.0)
Hemoglobin: 9.8 g/dL — ABNORMAL LOW (ref 12.0–16.0)
MCH: 26.5 pg (ref 26.0–34.0)
MCHC: 31.9 g/dL — ABNORMAL LOW (ref 32.0–36.0)
MCV: 82.9 fL (ref 80.0–100.0)
PLATELETS: 239 10*3/uL (ref 150–440)
RBC: 3.72 MIL/uL — ABNORMAL LOW (ref 3.80–5.20)
RDW: 14.4 % (ref 11.5–14.5)
WBC: 9.8 10*3/uL (ref 3.6–11.0)

## 2015-06-30 NOTE — Anesthesia Postprocedure Evaluation (Signed)
Anesthesia Post Note  Patient: Cheryl Fernandez  Procedure(s) Performed: Procedure(s) (LRB): CESAREAN SECTION (N/A)  Patient location during evaluation: [facility] Anesthesia Type: Spinal Level of consciousness: awake and alert and oriented Pain management: pain level controlled Respiratory status: spontaneous breathing, nonlabored ventilation and respiratory function stable Cardiovascular status: stable Postop Assessment: no headache, no backache and spinal receding Anesthetic complications: no    Last Vitals:  Filed Vitals:   06/30/15 0444 06/30/15 0819  BP: 99/52 99/55  Pulse: 61 68  Temp: 36.7 C 36.8 C  Resp: 20 18    Last Pain:  Filed Vitals:   06/30/15 0937  PainSc: 3                  Jezebel Pollet,  Margit BandaJohn M

## 2015-06-30 NOTE — Progress Notes (Signed)
Post Partum Day 1 Subjective: no complaints and up ad lib  Objective: Blood pressure 99/55, pulse 68, temperature 98.3 F (36.8 C), temperature source Oral, resp. rate 18, height 5\' 8"  (1.727 m), weight 189 lb (85.73 kg), last menstrual period 09/29/2014, SpO2 99 %, unknown if currently breastfeeding.  Physical Exam:  General: alert and cooperative  Heart: S1S2, RRR, No M/R/G. Lungs: CTA Bilat, no W/R/R. Lochia: appropriate Uterine Fundus: firm LTCS: intact, dressing removed and sutures intact DVT Evaluation: No evidence of DVT seen on physical exam.   Recent Labs  06/28/15 0908 06/30/15 0657  HGB 11.6* 9.8*  HCT 35.1 30.8*  WBC 8.7 9.8  PLT 264 239    Assessment/Plan: Contraception Depo-Provera at dc  DC on POD # 3.    LOS: 1 day   Sharee Pimplearon W Xander Jutras 06/30/2015, 10:03 AM

## 2015-07-01 MED ORDER — OXYCODONE-ACETAMINOPHEN 5-325 MG PO TABS: 1.0000 | ORAL_TABLET | ORAL | Status: DC | PRN

## 2015-07-01 NOTE — Progress Notes (Signed)
  Subjective:   "I feel well and want to go home today" via Interpreter  Objective:  Blood pressure 108/63, pulse 76, temperature 98.4 F (36.9 C), temperature source Oral, resp. rate 18, height 5\' 8"  (1.727 m), weight 189 lb (85.73 kg), last menstrual period 09/29/2014, SpO2 100 %, unknown if currently breastfeeding.  General: NAD Pulmonary: no increased work of breathing, Lungs CTA bilat, no W/R/R. Heart: S1S2, RRR, No M/R/G. Abdomen: non-distended, non-tender, fundus firm at level of umbilicus Incision: C/D/I. Extremities: no edema, no erythema, no tenderness  Results for orders placed or performed during the hospital encounter of 06/29/15 (from the past 72 hour(s))  CBC     Status: Abnormal   Collection Time: 06/30/15  6:57 AM  Result Value Ref Range   WBC 9.8 3.6 - 11.0 K/uL   RBC 3.72 (L) 3.80 - 5.20 MIL/uL   Hemoglobin 9.8 (L) 12.0 - 16.0 g/dL   HCT 86.530.8 (L) 78.435.0 - 69.647.0 %   MCV 82.9 80.0 - 100.0 fL   MCH 26.5 26.0 - 34.0 pg   MCHC 31.9 (L) 32.0 - 36.0 g/dL   RDW 29.514.4 28.411.5 - 13.214.5 %   Platelets 239 150 - 440 K/uL     Assessment:   29 y.o. [id number] postoperativeday # 2 stable Lactation   Plan:  1) Acute blood loss anemia - hemodynamically stable and asymptomatic - po ferrous sulfate  2)   / Varicella immune/RI/Rh pos.  3) TDAP status UTD  4) Breastfeeding well  5) Disposition: home today and plans to get IUD at 6 weeks

## 2015-07-01 NOTE — Progress Notes (Signed)
Discharged instructions reviewed with both patient and significant other.  Questions answered.  Discharge instructions signed.  Mom left via wheelchair with baby.

## 2015-07-01 NOTE — Discharge Instructions (Signed)
Cuidados en el postparto luego de un parto por cesrea  (Postpartum Care After Cesarean Delivery) Despus del parto (perodo de postparto), la estada normal en el hospital es de 24-72 horas. Si hubo problemas con el trabajo de parto o el parto, o si tiene otros problemas mdicos, es posible que deba permanecer en el hospital por ms tiempo.  Mientras est en el hospital, recibir ayuda e instrucciones sobre cmo cuidar de usted misma y de su beb recin nacido durante el postparto.  Mientras est en el hospital:   Es normal que sienta dolor o molestias en la incisin en el abdomen. Asegrese de decirle a las enfermeras si siente dolor, as como donde siente el dolor y qu empeora el dolor.  Si est amamantando, puede sentir contracciones dolorosas en el tero durante algunas semanas. Esto es normal. Las contracciones ayudan a que el tero vuelva a su tamao normal.  Es normal tener algo de sangrado despus del parto.  Durante los primeros 1-3 das despus del parto, el flujo es de color rojo y la cantidad puede ser similar a un perodo.  Es frecuente que el flujo se inicie y se detenga.  En los primeros das, puede eliminar algunos cogulos pequeos. Informe a las enfermeras si comienza a eliminar cogulos grandes o aumenta el flujo.  No  elimine los cogulos de sangre por el inodoro antes de que la enfermera los vea.  Durante los prximos 3 a 10 das despus del parto, el flujo debe ser ms acuoso y rosado o marrn.  De diez a catorce das despus del parto, el flujo debe ser una pequea cantidad de secrecin de color blanco amarillento.  La cantidad de flujo disminuir en las primeras semanas despus del parto. El flujo puede detenerse en 6-8 semanas. La mayora de las mujeres no tienen ms flujo a las 12 semanas despus del parto.  Usted debe cambiar sus apsitos con frecuencia.  Lvese bien las manos con agua y jabn durante al menos 20 segundos despus de cambiar el apsito, usar el  bao o antes de sostener o alimentar a su recin nacido.  Se le quitar la va intravenosa (IV) cuando ya est bebiendo suficientes lquidos.  El tubo de drenaje para la orina (catter urinario) que se inserta antes del parto puede ser retirado luego de 6-8 horas despus del parto o cuando las piernas vuelvan a tener sensibilidad. Usted puede sentir que tiene que vaciar la vejiga durante las primeras 6-8 horas despus de que le quiten el catter.  Si se siente dbil, mareada o se desmaya, llame a su enfermera antes de levantarse de la cama por primera vez y antes de tomar una ducha por primera vez.  En los primeros das despus del parto, podr sentir las mamas sensibles y llenas. Esto se llama congestin. La sensibilidad en los senos por lo general desaparece dentro de las 48-72 horas despus de que ocurre la congestin. Tambin puede notar que la leche se escapa de sus senos. Si no est amamantando no estimule sus pechos. La estimulacin de las mamas hace que sus senos produzcan ms leche.  Pasar tanto tiempo como sea posible con el beb recin nacido es muy importante. Durante este tiempo, usted y su beb deben sentirse cerca y conocerse uno al otro. Tener al beb en su habitacin (alojamiento conjunto) ayudar a fortalecer el vnculo con el beb recin nacido. Esto le dar tiempo para conocerlo y atenderlo de manera cmoda.  Las hormonas se modifican despus del parto. A   veces, los cambios hormonales pueden causar tristeza o ganas de llorar por un tiempo. Estos sentimientos no deben durar ms de unos pocos das. Si duran ms que eso, debe hablar con su mdico.  Si lo desea, hable con su mdico acerca de los mtodos de planificacin familiar o mtodos anticonceptivos.  Hable con su mdico acerca de las vacunas. El mdico puede indicarle que se aplique las siguientes vacunas antes de salir del hospital:  Vacuna contra el ttanos, la difteria y la tos ferina (Tdap) o el ttanos y la difteria (Td).  Es muy importante que usted y su familia (incluyendo a los abuelos) u otras personas que cuidan al recin nacido estn al da con las vacunas Tdap o Td. Las vacunas Tdap o Td pueden ayudar a proteger al recin nacido de enfermedades.  Inmunizacin contra la rubola.  Inmunizacin contra la varicela.  Inmunizacin contra la gripe. Usted debe recibir esta vacunacin anual si no la ha recibido durante el embarazo.   Esta informacin no tiene como fin reemplazar el consejo del mdico. Asegrese de hacerle al mdico cualquier pregunta que tenga.   Document Released: 06/30/2012 Elsevier Interactive Patient Education 2016 [employer]  

## 2015-07-01 NOTE — Discharge Summary (Signed)
Obstetric Discharge Summary   Patient ID: Cheryl Fernandez MRN: [id number] DOB/AGE: June 04, 1986 29 y.o.   Date of Admission: 06/29/2015  Date of Discharge: 07/01/15  Admitting Diagnosis: Scheduled cesarean section at 8076w0d  Secondary Diagnosis: none  Mode of Delivery: primary cesarean section and repeat cesarean section     Discharge Diagnosis: Reasons for cesarean section  Prior uterine surgery none   Intrapartum Procedures: none   Post partum procedures: none  Complications: none   Brief Hospital Course   POD#2(Cesarean Section): Cheryl Fernandez is a [id number] who underwent cesarean section on 06/29/2015.  Patient had an uncomplicated surgery; for further details of this surgery, please refer to the operative note.  Patient had an uncomplicated postpartum course.  By time of discharge on POD#2, her pain was controlled on oral pain medications; she had appropriate lochia and was ambulating, voiding without difficulty, tolerating regular diet and passing flatus.   She was deemed stable for discharge to home.    Labs: CBC Latest Ref Rng 06/30/2015 06/28/2015 12/03/2014  WBC 3.6 - 11.0 K/uL 9.8 8.7 13.1(H)  Hemoglobin 12.0 - 16.0 g/dL 1.4(N9.8(L) 11.6(L) 12.8  Hematocrit 35.0 - 47.0 % 30.8(L) 35.1 38.2  Platelets 150 - 440 K/uL 239 264 310   O POS  Physical exam:  Blood pressure 108/63, pulse 76, temperature 98.4 F (36.9 C), temperature source Oral, resp. rate 18, height 5\' 8"  (1.727 m), weight 189 lb (85.73 kg), last menstrual period 09/29/2014, SpO2 100 %, unknown if currently breastfeeding. General: alert and no distress Heart: S1S2, RRR, No M/R/G. Lungs: CTA bilat, no W/R/R. Lochia: appropriate Abdomen: soft, NT Uterine Fundus: firm Incision: healing well, no significant drainage, no dehiscence, no significant erythema Extremities: No evidence of DVT seen on physical exam. No lower extremity edema.  Discharge Instructions: Per After Visit Summary. Activity: Advance  as tolerated. Pelvic rest for 6 weeks.  Also refer to After Visit Summary Diet: Regular Medications:   Medication List    ASK your doctor about these medications        prenatal multivitamin Tabs tablet  Take 1 tablet by mouth at bedtime.       Outpatient follow up:  Postpartum contraception: IUD  Discharged Condition: stable  Discharged to: home   Newborn Data:  Baby Boy  named "Armando"  Disposition:home with mother  Apgars: APGAR (1 MIN): 9   APGAR (5 MINS): 9   APGAR (10 MINS):    Baby Feeding: Breast  [facility] with [facility] Interpreter to make appt for fu in 1-2 weeks with Dr Tildon HuskyHalfon. Sharee Pimplearon W Malacai Grantz, CNM 07/01/2015

## 2016-04-19 IMAGING — US US OB TRANSVAGINAL
1 series · 14 of 28 positions shown · non-contrast
Comparison: None.

CLINICAL DATA: Right-sided abdominal pain with vomiting this
weeks and 2 days pregnant based on her last menstrual period.

EXAM:
OBSTETRIC <14 WK US AND TRANSVAGINAL OB US
TECHNIQUE: Both transabdominal and transvaginal ultrasound examinations were
performed for complete evaluation of the gestation as well as the
maternal uterus, adnexal regions, and pelvic cul-de-sac.
Transvaginal technique was performed to assess early pregnancy.

[Series 1: us ob transvaginal · 0.18mm/px · 14 of 197 slices shown]
[im 8/197]
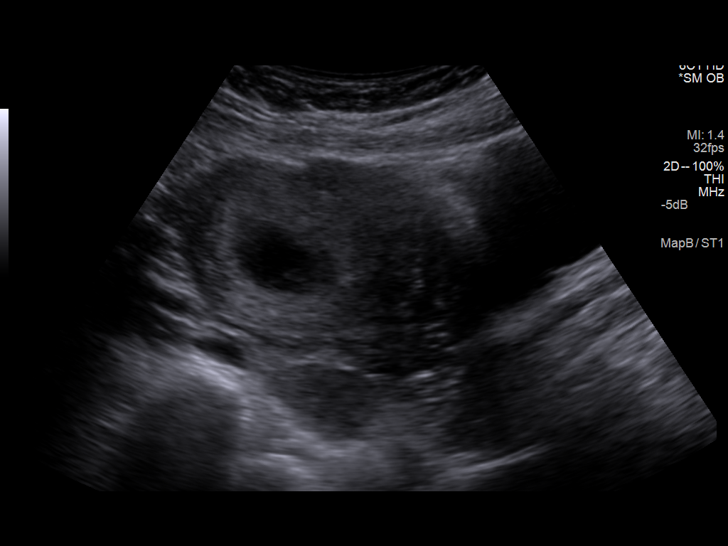
[im 22/197]
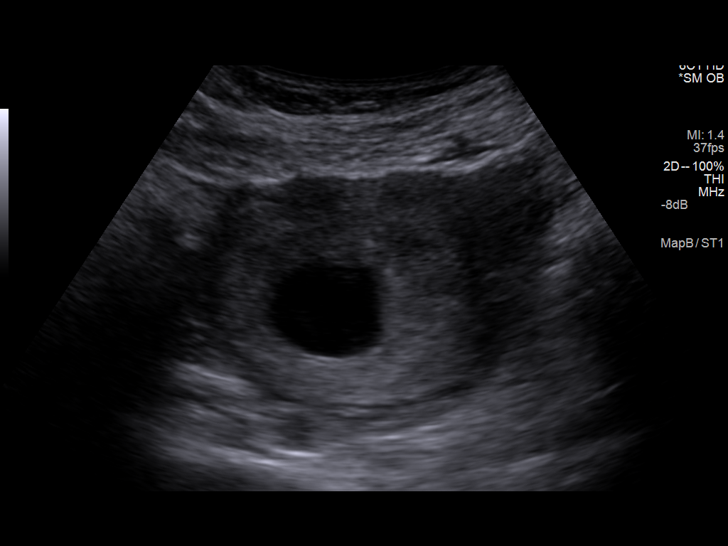
[im 37/197]
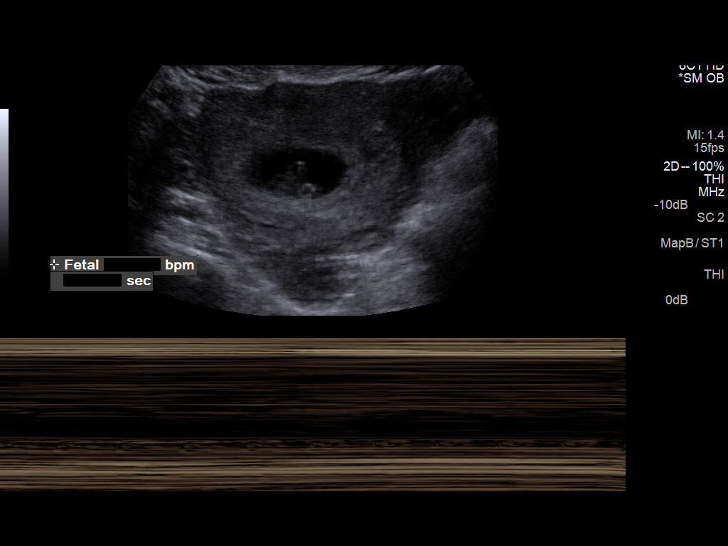
[im 51/197]
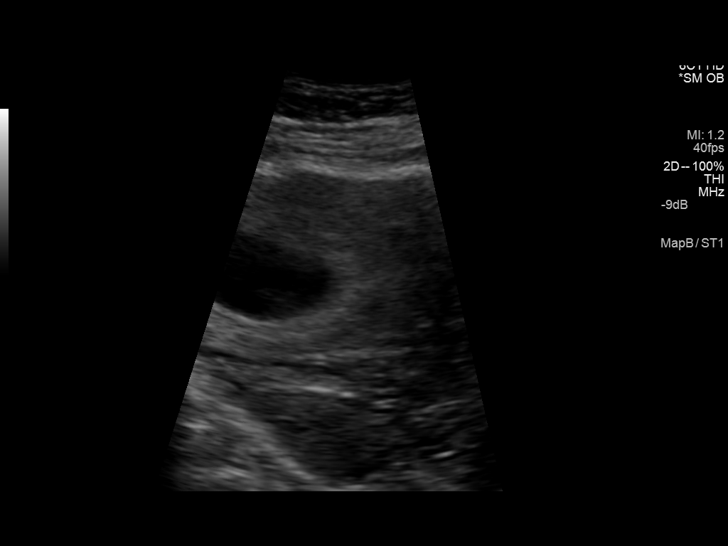
[im 66/197]
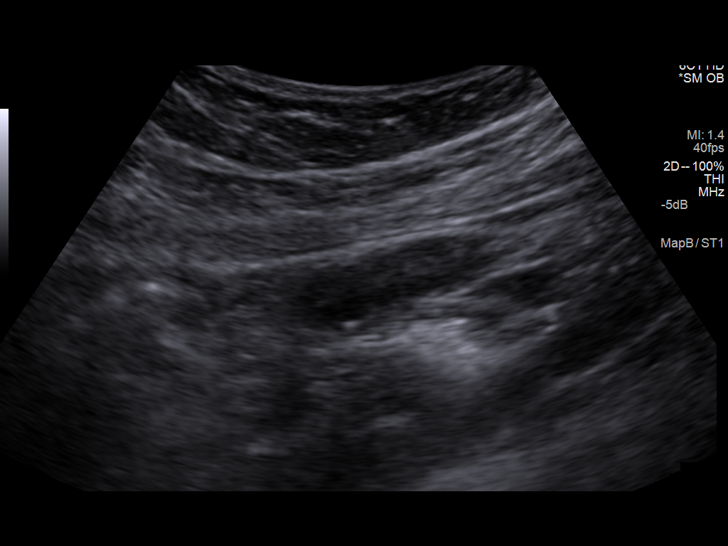
[im 80/197]
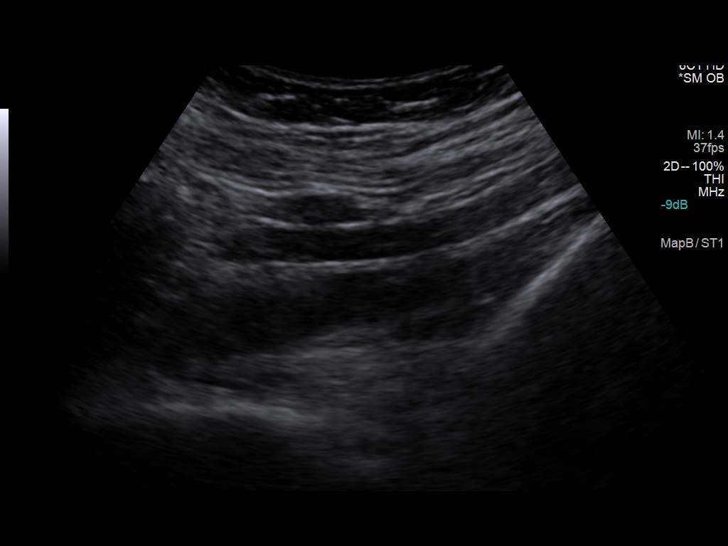
[im 95/197]
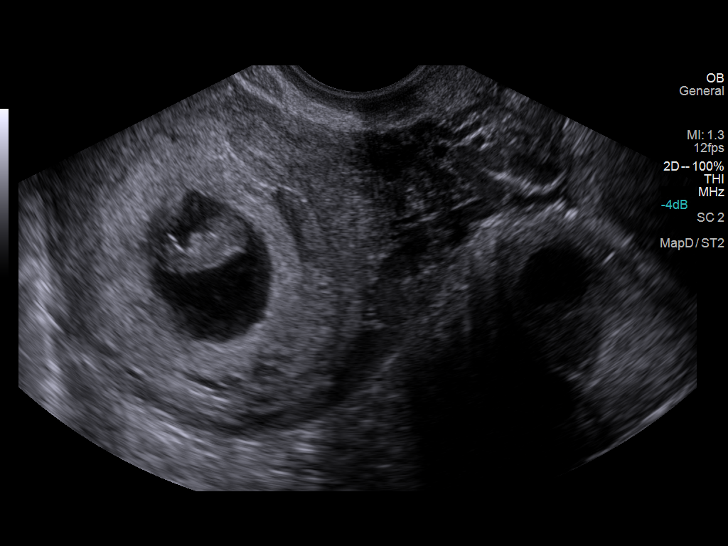
[im 109/197]
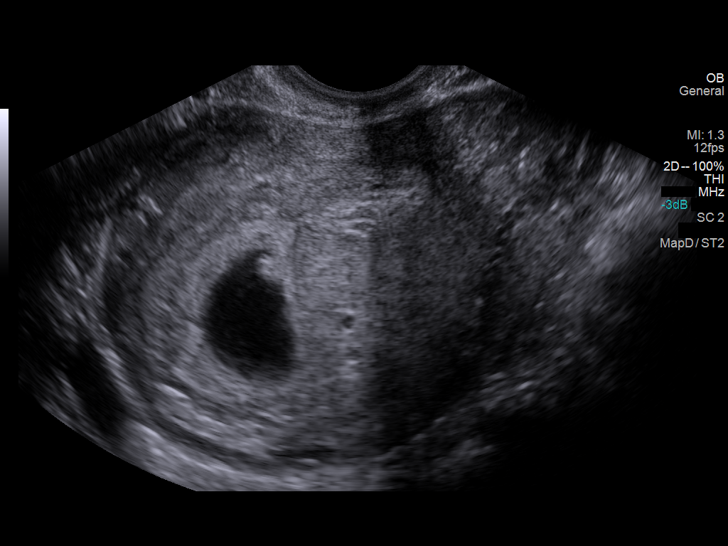
[im 124/197]
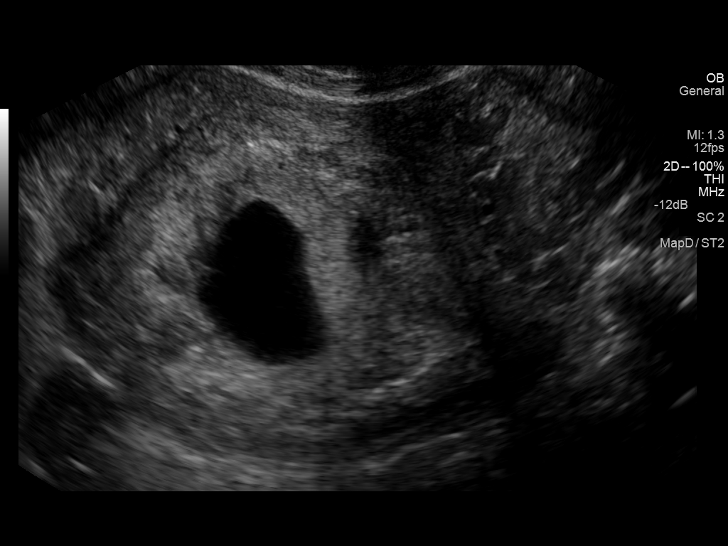
[im 138/197]
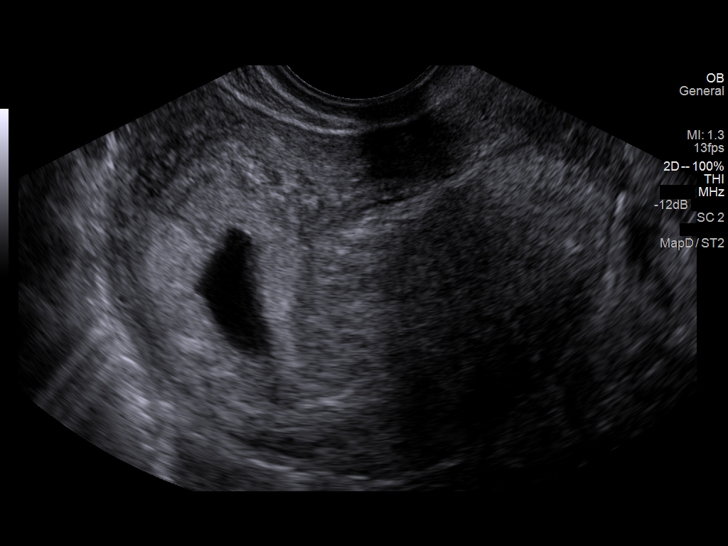
[im 153/197]
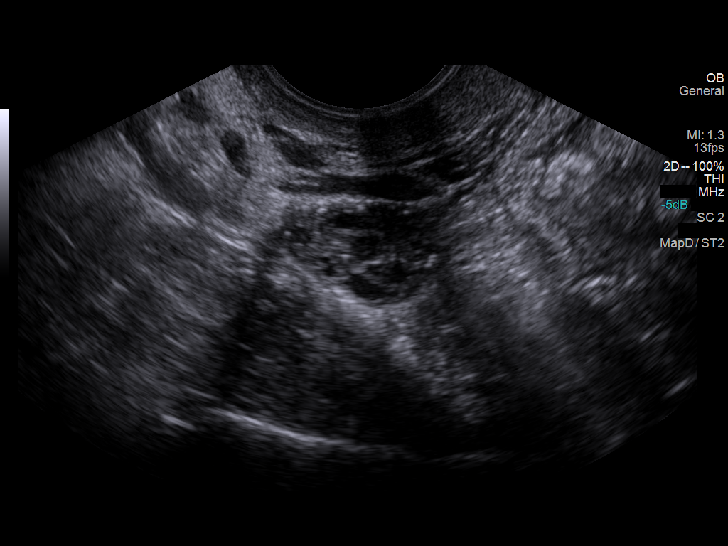
[im 167/197]
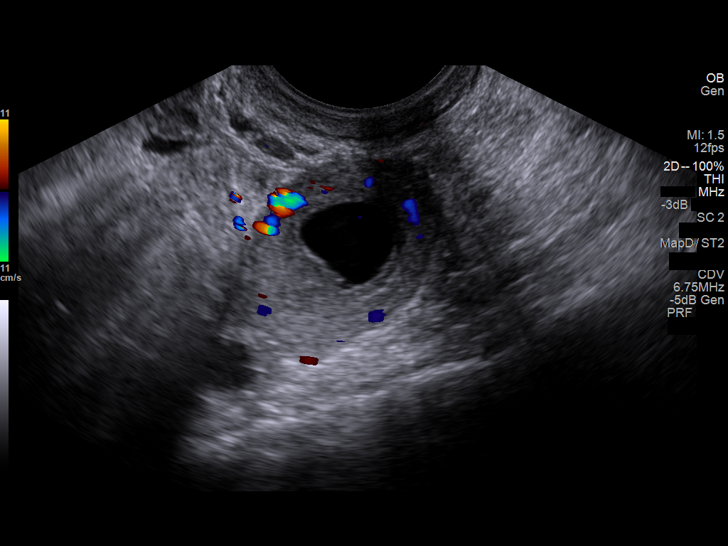
[im 182/197]
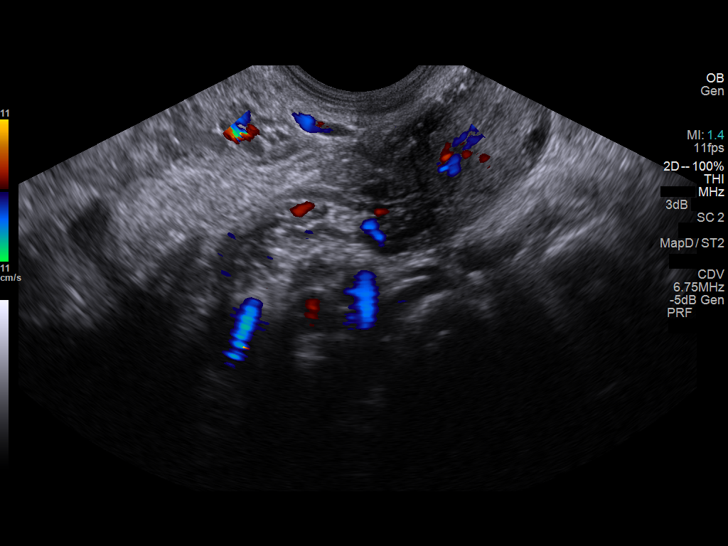
[im 197/197]
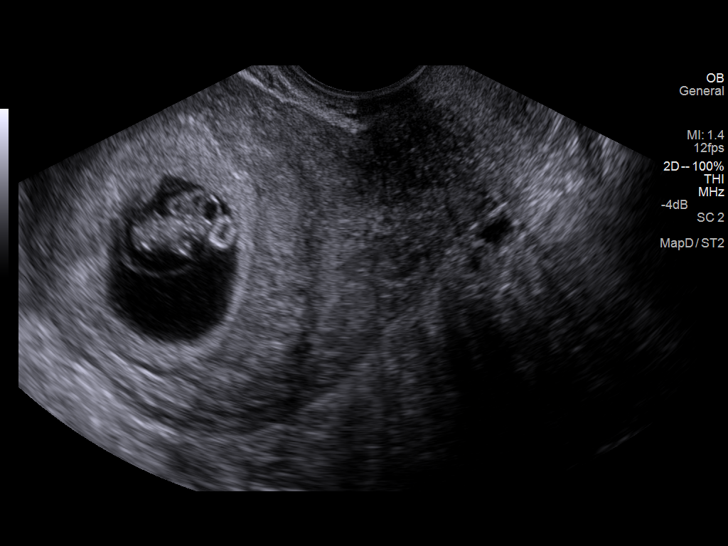

[14 of 28 positions shown; findings below may reference images not displayed]

FINDINGS: Intrauterine gestational sac: Visualized/normal in shape.

Yolk sac:  Yes

Embryo:  Yes

Cardiac Activity: Yes

Heart Rate: 165  bpm

CRL:  16  mm   8 w   0 d                  US EDC: 07/15/2015

Maternal uterus/adnexae: Sliver of a sub chronic hemorrhage
measuring 4 mm. No uterine masses. Sliver of fluid in the cervix,
nonspecific. Cervix otherwise unremarkable. Small amount pelvic free
fluid. 2.3 cm right ovarian hypoechoic lesion consistent with a
complex cyst likely a corpus luteum. No adnexal masses.
IMPRESSION: 1. Single live intrauterine pregnancy with a measured gestational
age of 8 weeks.
2. Small subchorionic hemorrhage. No other evidence of a pregnancy
complication.

## 2017-03-03 ENCOUNTER — Ambulatory Visit
Admission: EM | Admit: 2017-03-03 | Discharge: 2017-03-03 | Disposition: A | Discharge status: Home / Self Care | Payer: Self-pay | Attending: Emergency Medicine | Admitting: Emergency Medicine

## 2017-03-03 DIAGNOSIS — W57XXXA Bitten or stung by nonvenomous insect and other nonvenomous arthropods, initial encounter: Secondary | ICD-10-CM

## 2017-03-03 DIAGNOSIS — L03116 Cellulitis of left lower limb: Secondary | ICD-10-CM

## 2017-03-03 DIAGNOSIS — S90862A Insect bite (nonvenomous), left foot, initial encounter: Secondary | ICD-10-CM

## 2017-03-03 DIAGNOSIS — N912 Amenorrhea, unspecified: Secondary | ICD-10-CM

## 2017-03-03 LAB — PREGNANCY, URINE: Preg Test, Ur: NEGATIVE

## 2017-03-03 MED ORDER — CEPHALEXIN 500 MG PO CAPS: 500.0000 mg | ORAL_CAPSULE | Freq: Three times a day (TID) | ORAL | 0 refills | Status: AC

## 2017-03-03 MED ORDER — METHYLPREDNISOLONE SODIUM SUCC 125 MG IJ SOLR
125.0000 mg | Freq: Once | INTRAMUSCULAR | Status: AC
  Administered 2017-03-03: 125 mg via INTRAMUSCULAR

## 2017-03-03 NOTE — ED Provider Notes (Signed)
[facility]    CSN: [id number] Arrival date & time: 03/03/17  1423     History   Chief Complaint Chief Complaint  Patient presents with  . Insect Bite    HPI Cheryl Fernandez is a 31 y.o. female.   31 year old female presents with an insect bite to her left foot/ankle that occurred last evening. Uncertain what type of insect but may be spider bite. Started swelling a little last night but woke up today with very swollen, red and warm ankle/foot. Swelling has spread to base of toes to mid-calf. Also having some tingling in left foot due to swelling. Minimal pain. Has used OTC herbal remedy for swelling with no relief. No other insect bites. No chronic health issues. Takes no daily medication. Last period 01/26/17 and usually 28 to 30 day cycle. Took home pregnancy test 3 days ago which was negative but requests repeat urine test today.    The history is provided by the patient.    History reviewed. No pertinent past medical history.  There are no active problems to display for this patient.   Past Surgical History:  Procedure Laterality Date  . CESAREAN SECTION N/A 11/23/2013  . CESAREAN SECTION  06/29/2015    OB History    Gravida Para Term Preterm AB Living   3 2   2   2    SAB TAB Ectopic Multiple Live Births           2       Home Medications    Prior to Admission medications   Medication Sig Start Date End Date Taking? Authorizing Provider  cephALEXin (KEFLEX) 500 MG capsule Take 1 capsule (500 mg total) by mouth 3 (three) times daily. 03/03/17 03/10/17  Sudie GrumblingAmyot, Ann Berry, NP    Family History Family History  Problem Relation Age of Onset  . Hypertension Mother   . Diabetes Mother   . Hypertension Father   . Diabetes Father     Social History Social History  Substance Use Topics  . Smoking status: Never Smoker  . Smokeless tobacco: Never Used  . Alcohol use No     Allergies   Patient has no known allergies.   Review of  Systems Review of Systems  Constitutional: Negative for appetite change, chills, fatigue and fever.  HENT: Negative for sore throat and trouble swallowing.   Respiratory: Negative for cough, chest tightness, shortness of breath and wheezing.   Cardiovascular: Negative for chest pain.  Gastrointestinal: Negative for diarrhea, nausea and vomiting.  Musculoskeletal: Positive for joint swelling. Negative for arthralgias and myalgias.  Skin: Positive for color change and wound. Negative for rash.  Allergic/Immunologic: Negative for immunocompromised state.  Neurological: Positive for numbness. Negative for dizziness, tremors, seizures, syncope, weakness and headaches.  Hematological: Negative for adenopathy.     Physical Exam Triage Vital Signs ED Triage Vitals  Enc Vitals Group     BP 03/03/17 1450 107/78     Pulse Rate 03/03/17 1450 66     Resp --      Temp 03/03/17 1450 98.1 F (36.7 C)     Temp Source 03/03/17 1450 Oral     SpO2 03/03/17 1450 99 %     Weight 03/03/17 1458 165 lb (74.8 kg)     Height 03/03/17 1457 5\' 8"  (1.727 m)     Head Circumference --      Peak Flow --      Pain Score --  Pain Loc --      Pain Edu? --      Excl. in GC? --    No data found.   Updated Vital Signs BP 107/78 (BP Location: Right Arm)   Pulse 66   Temp 98.1 F (36.7 C) (Oral)   Ht 5\' 8"  (1.727 m)   Wt 165 lb (74.8 kg)   LMP 01/26/2017   SpO2 99%   BMI 25.09 kg/m   Visual Acuity Right Eye Distance:   Left Eye Distance:   Bilateral Distance:    Right Eye Near:   Left Eye Near:    Bilateral Near:     Physical Exam  Constitutional: She is oriented to person, place, and time. She appears well-developed and well-nourished. No distress.  HENT:  Head: Normocephalic and atraumatic.  Mouth/Throat: Oropharynx is clear and moist.  Eyes: Conjunctivae and EOM are normal.  Neck: Normal range of motion. Neck supple.  Cardiovascular: Normal rate, regular rhythm and normal heart  sounds.   Pulmonary/Chest: Effort normal and breath sounds normal. No respiratory distress. She has no wheezes.  Musculoskeletal: Normal range of motion. She exhibits edema and tenderness.       Left foot: There is tenderness and swelling. There is normal range of motion, normal capillary refill, no crepitus, no deformity and no laceration.       Feet:  Bite mark present on left foot just below and posterior to lateral malleolus. Swelling present from base of toes to mid-calf. Redness present from mid-dorsal aspect of foot to just above ankle. Slightly tender at bite mark. Has full range of motion of ankle/foot. Good pedal pulses and normal capillary refill. No neuro deficits noted.   Lymphadenopathy:    She has no cervical adenopathy.  Neurological: She is alert and oriented to person, place, and time. She has normal strength. No sensory deficit.  Skin: Skin is warm. Capillary refill takes less than 2 seconds. No rash noted. There is erythema.  Psychiatric: She has a normal mood and affect. Her behavior is normal. Judgment and thought content normal.     UC Treatments / Results  Labs (all labs ordered are listed, but only abnormal results are displayed) Labs Reviewed  PREGNANCY, URINE    EKG  EKG Interpretation None       Radiology No results found.  Procedures Procedures (including critical care time)  Medications Ordered in UC Medications  methylPREDNISolone sodium succinate (SOLU-MEDROL) 125 mg/2 mL injection 125 mg (125 mg Intramuscular Given 03/03/17 1559)     Initial Impression / Assessment and Plan / UC Course  I have reviewed the triage vital signs and the nursing notes.  Pertinent labs & imaging results that were available during my care of the patient were reviewed by me and considered in my medical decision making (see chart for details).   Reviewed negative urine pregnancy test with patient. Gave Solu-Medrol 125mg  IM now to help with swelling and inflammation.  Recommend apply ice to area for comfort. May also use OTC Benadryl 25mg  every 6 hours as needed for swelling. Since possibility of pregnancy (may be too early to detect) is still present, will avoid Bactrim and start Keflex 500mg  3 times a day as directed for possible Cellulitis. Continue to monitor area. Recommend follow-up in 2 to 3 days or sooner if swelling persists or increased pain occurs.    Final Clinical Impressions(s) / UC Diagnoses   Final diagnoses:  Insect bite, initial encounter  Cellulitis of left lower extremity  Amenorrhea    New Prescriptions Discharge Medication List as of 03/03/2017  4:02 PM    START taking these medications   Details  cephALEXin (KEFLEX) 500 MG capsule Take 1 capsule (500 mg total) by mouth 3 (three) times daily., Starting Tue 03/03/2017, Until Tue 03/10/2017, Normal         Controlled Substance Prescriptions [place] Controlled Substance Registry consulted? No   Sudie Grumbling, NP 03/03/17 2235

## 2017-03-03 NOTE — ED Triage Notes (Signed)
Patient stated she was outside when felt something, she stated last night she had tingling and numbness. This morning she noticed swelling and it keeps swelling. Spot is on back of ankle with redness and swelling alllaround outside of ankle and foot.

## 2017-03-03 NOTE — Discharge Instructions (Signed)
You were given a shot of a steroid medication to help with swelling. Recommend apply ice to area for comfort. Start Keflex (antibiotic) 3 times a day for 7 days. Monitor area. If swelling continues to spread or increased pain occurs, follow-up here for recheck.

## 2017-03-04 ENCOUNTER — Encounter: Payer: Self-pay | Admitting: Emergency Medicine

## 2017-03-04 ENCOUNTER — Emergency Department
Admission: EM | Admit: 2017-03-04 | Discharge: 2017-03-04 | Disposition: A | Discharge status: Home / Self Care | Payer: Self-pay | Attending: Emergency Medicine | Admitting: Emergency Medicine

## 2017-03-04 DIAGNOSIS — W57XXXA Bitten or stung by nonvenomous insect and other nonvenomous arthropods, initial encounter: Secondary | ICD-10-CM | POA: Insufficient documentation

## 2017-03-04 DIAGNOSIS — L03116 Cellulitis of left lower limb: Secondary | ICD-10-CM | POA: Insufficient documentation

## 2017-03-04 MED ORDER — PREDNISONE 20 MG PO TABS
60.0000 mg | ORAL_TABLET | Freq: Once | ORAL | Status: AC
  Administered 2017-03-04: 60 mg via ORAL
  Filled 2017-03-04: qty 3

## 2017-03-04 MED ORDER — DIPHENHYDRAMINE HCL 25 MG PO CAPS
50.0000 mg | ORAL_CAPSULE | Freq: Once | ORAL | Status: AC
  Administered 2017-03-04: 50 mg via ORAL
  Filled 2017-03-04: qty 2

## 2017-03-04 MED ORDER — DIPHENHYDRAMINE HCL 25 MG PO CAPS
ORAL_CAPSULE | ORAL | Status: AC
  Filled 2017-03-04: qty 2

## 2017-03-04 NOTE — ED Provider Notes (Signed)
[facility] Emergency Department Provider Note   First MD Initiated Contact with Patient 03/04/17 0327     (approximate)  I have reviewed the triage vital signs and the nursing notes.   HISTORY  Chief Complaint Leg Pain    HPI Cheryl Fernandez is a 31 y.o. female presents to emergency department with history of being "stung by a wasp or bee on the left lateral malleoli which patient was seen at urgent care yesterday patient states that she was given some antibiotics and received a "shot" diagnosed with cellulitis. Patient presents emergency department tonight secondary to increased swelling to the dorsal aspect of the left foot patient denies any increase in pain. Patient denies any increase in redness. Patient denies any fever afebrile on presentation   Past medical history Left foot cellulitis secondary to insect bite diagnosed yesterday There are no active problems to display for this patient.   Past Surgical History:  Procedure Laterality Date  . CESAREAN SECTION N/A 11/23/2013  . CESAREAN SECTION  06/29/2015    Prior to Admission medications   Medication Sig Start Date End Date Taking? Authorizing Provider  cephALEXin (KEFLEX) 500 MG capsule Take 1 capsule (500 mg total) by mouth 3 (three) times daily. 03/03/17 03/10/17  Sudie Grumbling, NP    Allergies No known drug allergies  Family History  Problem Relation Age of Onset  . Hypertension Mother   . Diabetes Mother   . Hypertension Father   . Diabetes Father     Social History Social History  Substance Use Topics  . Smoking status: Never Smoker  . Smokeless tobacco: Never Used  . Alcohol use No    Review of Systems Constitutional: No fever/chills Eyes: No visual changes. ENT: No sore throat. Cardiovascular: Denies chest pain. Respiratory: Denies shortness of breath. Gastrointestinal: No abdominal pain.  No nausea, no vomiting.  No diarrhea.  No constipation. Genitourinary:  Negative for dysuria. Musculoskeletal: Negative for neck pain.  Negative for back pain. Integumentary: Negative for rash. Neurological: Negative for headaches, focal weakness or numbness.   ____________________________________________   PHYSICAL EXAM:  VITAL SIGNS: ED Triage Vitals  Enc Vitals Group     BP 03/04/17 0306 (!) 153/90     Pulse Rate 03/04/17 0306 (!) 125     Resp 03/04/17 0306 (!) 22     Temp 03/04/17 0306 98.3 F (36.8 C)     Temp Source 03/04/17 0306 Oral     SpO2 03/04/17 0306 100 %     Weight 03/04/17 0308 81.6 kg (180 lb)     Height 03/04/17 0308 1.651 m (5\' 5" )     Head Circumference --      Peak Flow --      Pain Score 03/04/17 0306 0     Pain Loc --      Pain Edu? --      Excl. in GC? --     Constitutional: Alert and oriented. Well appearing and in no acute distress. Eyes: Conjunctivae are normal.  Head: Atraumatic. Mouth/Throat: Mucous membranes are moist. Neck: No stridor.   Cardiovascular: Normal rate, regular rhythm. Good peripheral circulation. Grossly normal heart sounds. Respiratory: Normal respiratory effort.  No retractions. Lungs CTAB.  Musculoskeletal: Erythema noted over the left lateral malleoli. Swelling noted to the dorsal aspect of the left foot. Area is not tender to touch.  Neurologic:  Normal speech and language. No gross focal neurologic deficits are appreciated.  Skin:  Skin is warm, dry and intact.  No rash noted.     Procedures   ____________________________________________   INITIAL IMPRESSION / ASSESSMENT AND PLAN / ED COURSE  Pertinent labs & imaging results that were available during my care of the patient were reviewed by me and considered in my medical decision making (see chart for details).   31 year old female with history of cellulitis secondary to an insect bite which occurred yesterday. Review of the patient's charts reveal that she received IM shot of Solu-Medrol as well as Keflex from the urgent care.  Patient will be given an additional dose of prednisone now as well as Benadryl.     ____________________________________________  FINAL CLINICAL IMPRESSION(S) / ED DIAGNOSES  Final diagnoses:  Bug bite with infection, initial encounter  Cellulitis of left lower extremity     MEDICATIONS GIVEN DURING THIS VISIT:  Medications  diphenhydrAMINE (BENADRYL) capsule 50 mg (50 mg Oral Refused 03/04/17 0356)  predniSONE (DELTASONE) tablet 60 mg (60 mg Oral Given 03/04/17 0351)     NEW OUTPATIENT MEDICATIONS STARTED DURING THIS VISIT:  New Prescriptions   No medications on file    Modified Medications   No medications on file    Discontinued Medications   No medications on file     Note:  This document was prepared using Dragon voice recognition software and may include unintentional dictation errors.    Darci CurrentBrown, [place] N, MD 03/04/17 [phone]

## 2017-03-04 NOTE — ED Triage Notes (Signed)
Pt in with co possible bite to anterior left lower leg, went to urgent care yesterday and given antibiotics and "shot" and dx with cellulitis. Now states swelling is spreading further down in foot, and has tingling. Moderate amount of swelling and redness noted.

## 2017-05-12 ENCOUNTER — Other Ambulatory Visit: Payer: Self-pay | Admitting: Nurse Practitioner

## 2017-05-12 DIAGNOSIS — Z369 Encounter for antenatal screening, unspecified: Secondary | ICD-10-CM

## 2017-05-12 LAB — OB RESULTS CONSOLE RUBELLA ANTIBODY, IGM: Rubella: IMMUNE

## 2017-05-12 LAB — OB RESULTS CONSOLE VARICELLA ZOSTER ANTIBODY, IGG: Varicella: IMMUNE

## 2017-05-13 LAB — OB RESULTS CONSOLE RPR: RPR: NONREACTIVE

## 2017-05-13 LAB — OB RESULTS CONSOLE HEPATITIS B SURFACE ANTIGEN: Hepatitis B Surface Ag: NEGATIVE

## 2017-05-13 LAB — OB RESULTS CONSOLE HGB/HCT, BLOOD
HEMATOCRIT: 37
Hemoglobin: 12.1

## 2017-05-13 LAB — OB RESULTS CONSOLE PLATELET COUNT: PLATELETS: 302

## 2017-05-14 LAB — OB RESULTS CONSOLE GC/CHLAMYDIA
Chlamydia: NEGATIVE
Gonorrhea: NEGATIVE

## 2017-05-14 LAB — OB RESULTS CONSOLE HIV ANTIBODY (ROUTINE TESTING): HIV: NONREACTIVE

## 2017-06-01 ENCOUNTER — Ambulatory Visit (HOSPITAL_BASED_OUTPATIENT_CLINIC_OR_DEPARTMENT_OTHER)
Admission: RE | Admit: 2017-06-01 | Discharge: 2017-06-01 | Disposition: A | Discharge status: Home / Self Care | Payer: Medicaid Other | Source: Ambulatory Visit | Attending: Maternal & Fetal Medicine | Admitting: Maternal & Fetal Medicine

## 2017-06-01 ENCOUNTER — Ambulatory Visit
Admission: RE | Admit: 2017-06-01 | Discharge: 2017-06-01 | Disposition: A | Discharge status: Home / Self Care | Payer: Medicaid Other | Source: Ambulatory Visit | Attending: Nurse Practitioner | Admitting: Nurse Practitioner

## 2017-06-01 VITALS — BP 121/70 | HR 84 | Temp 98.0°F | Resp 17 | Ht 68.0 in | Wt 183.4 lb

## 2017-06-01 DIAGNOSIS — Z3A11 11 weeks gestation of pregnancy: Secondary | ICD-10-CM | POA: Insufficient documentation

## 2017-06-01 DIAGNOSIS — Z3682 Encounter for antenatal screening for nuchal translucency: Secondary | ICD-10-CM | POA: Insufficient documentation

## 2017-06-01 DIAGNOSIS — Z369 Encounter for antenatal screening, unspecified: Secondary | ICD-10-CM

## 2017-06-01 NOTE — Progress Notes (Signed)
Referring physician:  [facility] Department Length of Consultation: 40 minutes   Cheryl Fernandez  was referred to [facility] for genetic counseling to review prenatal screening and testing options.  This note summarizes the information we discussed with the aid of a Spanish interpreter and the genetic counseling intern, Alean Rinne.    We offered the following routine screening tests for this pregnancy:  First trimester screening, which includes nuchal translucency ultrasound screen and first trimester maternal serum marker screening.  The nuchal translucency has approximately an 80% detection rate for Down syndrome and can be positive for other chromosome abnormalities as well as congenital heart defects.  When combined with a maternal serum marker screening, the detection rate is up to 90% for Down syndrome and up to 97% for trisomy 18.     Maternal serum marker screening, a blood test that measures pregnancy proteins, can provide risk assessments for Down syndrome, trisomy 18, and open neural tube defects (spina bifida, anencephaly). Because it does not directly examine the fetus, it cannot positively diagnose or rule out these problems.  Targeted ultrasound uses high frequency sound waves to create an image of the developing fetus.  An ultrasound is often recommended as a routine means of evaluating the pregnancy.  It is also used to screen for fetal anatomy problems (for example, a heart defect) that might be suggestive of a chromosomal or other abnormality.   Should these screening tests indicate an increased concern, then the following additional testing options would be offered:  The chorionic villus sampling procedure is available for first trimester chromosome analysis.  This involves the withdrawal of a small amount of chorionic villi (tissue from the developing placenta).  Risk of pregnancy loss is estimated to be approximately 1 in 200 to 1  in 100 (0.5 to 1%).  There is approximately a 1% (1 in 100) chance that the CVS chromosome results will be unclear.  Chorionic villi cannot be tested for neural tube defects.     Amniocentesis involves the removal of a small amount of amniotic fluid from the sac surrounding the fetus with the use of a thin needle inserted through the maternal abdomen and uterus.  Ultrasound guidance is used throughout the procedure.  Fetal cells from amniotic fluid are directly evaluated and > 99.5% of chromosome problems and > 98% of open neural tube defects can be detected. This procedure is generally performed after the 15th week of pregnancy.  The main risks to this procedure include complications leading to miscarriage in less than 1 in 200 cases (0.5%).  As another option for information if the pregnancy is suspected to be an an increased chance for certain chromosome conditions, we also reviewed the availability of cell free fetal DNA testing from maternal blood to determine whether or not the baby may have either Down syndrome, trisomy 65, or trisomy 77.  This test utilizes a maternal blood sample and DNA sequencing technology to isolate circulating cell free fetal DNA from maternal plasma.  The fetal DNA can then be analyzed for DNA sequences that are derived from the three most common chromosomes involved in aneuploidy, chromosomes 13, 18, and 21.  If the overall amount of DNA is greater than the expected level for any of these chromosomes, aneuploidy is suspected.  While we do not consider it a replacement for invasive testing and karyotype analysis, a negative result from this testing would be reassuring, though not a guarantee of a normal chromosome complement  for the baby.  An abnormal result is certainly suggestive of an abnormal chromosome complement, though we would still recommend CVS or amniocentesis to confirm any findings from this testing.  Cystic Fibrosis and Spinal Muscular Atrophy (SMA) screening were  also discussed with the patient. Both conditions are recessive, which means that both parents must be carriers in order to have a child with the disease.  Cystic fibrosis (CF) is one of the most common genetic conditions in persons of Caucasian ancestry.  This condition occurs in approximately 1 in 2,500 Caucasian persons and results in thickened secretions in the lungs, digestive, and reproductive systems.  For a baby to be at risk for having CF, both of the parents must be carriers for this condition.  Approximately 1 in 57 Caucasian persons is a carrier for CF.  Current carrier testing looks for the most common mutations in the gene for CF and can detect approximately 90% of carriers in the Caucasian population.  This means that the carrier screening can greatly reduce, but cannot eliminate, the chance for an individual to have a child with CF.  If an individual is found to be a carrier for CF, then carrier testing would be available for the partner. As part of [place]'s newborn screening profile, all babies born in the state of [place] will have a two-tier screening process.  Specimens are first tested to determine the concentration of immunoreactive trypsinogen (IRT).  The top 5% of specimens with the highest IRT values then undergo DNA testing using a panel of over 40 common CF mutations. SMA is a neurodegenerative disorder that leads to atrophy of skeletal muscle and overall weakness.  This condition is also more prevalent in the Caucasian population, with 1 in 40-1 in 60 persons being a carrier and 1 in 6,000-1 in 10,000 children being affected.  There are multiple forms of the disease, with some causing death in infancy to other forms with survival into adulthood.  The genetics of SMA is complex, but carrier screening can detect up to 95% of carriers in the Caucasian population.  Similar to CF, a negative result can greatly reduce, but cannot eliminate, the chance to have a child with  SMA.  We obtained a detailed family history and pregnancy history.  The patient reported that her brother passed away at age 44 years due to an aneurism.  Their paternal grandmother also passed away from a stroke related to aneurism.  Neither had any other health concerns or known diagnoses.  Though there may be inherited factors involved in aneurism, these may also occur sporadically.  More information would be needed to determine if other family members may be at increased risk for a similar condition.  There was also a strong family history of diabetes and hypertension, which we review likely have inherited as well as lifestyle factors.  The remainder of the family history was reported to be unremarkable for birth defects, intellectual delays, recurrent pregnancy loss or known chromosome abnormalities.  Ms. Moulin stated that this is her fourth pregnancy.  The couple has two healthy children and had one first trimester miscarriage.  She reported no complications or exposures in this pregnancy that would be expected to increase the risk for birth defects.  After consideration of the options, Ms. Riese elected to proceed with first trimester screening and to declined CF and SMA carrier screening.  An ultrasound was performed at the time of the visit.  The gestational age was consistent with  64  weeks, 5 days.  Fetal anatomy could not be assessed due to early gestational age.  Please refer to the ultrasound report for details of that study.  Ms. Herma ArdCarmona-Frias was encouraged to call with questions or concerns.  We can be contacted at [phone].    Cherly Andersoneborah F. Gladyes Kudo, MS, CGC

## 2017-06-08 ENCOUNTER — Telehealth: Payer: Self-pay | Admitting: Obstetrics and Gynecology

## 2017-06-08 NOTE — Telephone Encounter (Signed)
Ms. Herma ArdCarmona-Frias  elected to undergo First Trimester screening on 06/01/2017.  To review, first trimester screening, includes nuchal translucency ultrasound screen and/or first trimester maternal serum marker screening.  The nuchal translucency has approximately an 80% detection rate for Down syndrome and can be positive for other chromosome abnormalities as well as heart defects.  When combined with a maternal serum marker screening, the detection rate is up to 90% for Down syndrome and up to 97% for trisomy 13 and 18.     The results of the First Trimester Nuchal Translucency and Biochemical Screening were within normal range.  The risk for Down syndrome is now estimated to be 1 in 6,088.  The risk for Trisomy 13/18 is less than 1 in 10,000.  Should more definitive information be desired, we would offer amniocentesis.  Because we do not yet know the effectiveness of combined first and second trimester screening, we do not recommend a maternal serum screen to assess the chance for chromosome conditions.  However, if screening for neural tube defects is desired, maternal serum screening for AFP only can be performed between 15 and [redacted] weeks gestation.     Cherly Andersoneborah F. Story Conti, MS, CGC

## 2017-06-16 ENCOUNTER — Other Ambulatory Visit: Payer: Self-pay

## 2017-06-16 ENCOUNTER — Emergency Department
Admission: EM | Admit: 2017-06-16 | Discharge: 2017-06-16 | Disposition: A | Discharge status: Home / Self Care | Payer: Medicaid Other | Attending: Emergency Medicine | Admitting: Emergency Medicine

## 2017-06-16 DIAGNOSIS — Z041 Encounter for examination and observation following transport accident: Secondary | ICD-10-CM | POA: Diagnosis not present
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
## 2017-06-16 NOTE — ED Notes (Signed)
FHT completed per MD order, strong and present however, unable to count fetal heart rate due to baby moving.

## 2017-07-23 ENCOUNTER — Ambulatory Visit
Admission: RE | Admit: 2017-07-23 | Discharge: 2017-07-23 | Disposition: A | Discharge status: Home / Self Care | Payer: Medicaid Other | Source: Ambulatory Visit | Attending: Maternal & Fetal Medicine | Admitting: Maternal & Fetal Medicine

## 2017-07-23 ENCOUNTER — Other Ambulatory Visit: Payer: Self-pay | Admitting: *Deleted

## 2017-07-23 DIAGNOSIS — Z3A19 19 weeks gestation of pregnancy: Secondary | ICD-10-CM | POA: Diagnosis not present

## 2017-07-23 DIAGNOSIS — Z3482 Encounter for supervision of other normal pregnancy, second trimester: Secondary | ICD-10-CM

## 2017-07-23 DIAGNOSIS — Z362 Encounter for other antenatal screening follow-up: Secondary | ICD-10-CM | POA: Diagnosis not present

## 2017-09-21 LAB — OB RESULTS CONSOLE HIV ANTIBODY (ROUTINE TESTING): HIV: NONREACTIVE

## 2017-09-22 LAB — OB RESULTS CONSOLE RPR: RPR: NONREACTIVE

## 2017-09-22 LAB — OB RESULTS CONSOLE PLATELET COUNT: PLATELETS: 275

## 2017-11-18 LAB — OB RESULTS CONSOLE GBS: STREP GROUP B AG: POSITIVE

## 2017-11-30 ENCOUNTER — Inpatient Hospital Stay
Admission: EM | Admit: 2017-11-30 | Discharge: 2017-12-02 | DRG: 784 | Disposition: A | Discharge status: Home / Self Care | Payer: Medicaid Other | Attending: Obstetrics and Gynecology | Admitting: Obstetrics and Gynecology

## 2017-11-30 ENCOUNTER — Inpatient Hospital Stay: Payer: Medicaid Other | Admitting: Anesthesiology

## 2017-11-30 ENCOUNTER — Other Ambulatory Visit: Payer: Self-pay

## 2017-11-30 ENCOUNTER — Encounter
Admission: EM | Disposition: A | Discharge status: Home / Self Care | Payer: Self-pay | Source: Home / Self Care | Attending: Obstetrics and Gynecology

## 2017-11-30 ENCOUNTER — Encounter: Payer: Self-pay | Admitting: Obstetrics and Gynecology

## 2017-11-30 DIAGNOSIS — O34211 Maternal care for low transverse scar from previous cesarean delivery: Secondary | ICD-10-CM | POA: Diagnosis present

## 2017-11-30 DIAGNOSIS — D62 Acute posthemorrhagic anemia: Secondary | ICD-10-CM | POA: Diagnosis present

## 2017-11-30 DIAGNOSIS — O99824 Streptococcus B carrier state complicating childbirth: Secondary | ICD-10-CM | POA: Diagnosis present

## 2017-11-30 DIAGNOSIS — Z302 Encounter for sterilization: Secondary | ICD-10-CM | POA: Diagnosis not present

## 2017-11-30 DIAGNOSIS — O9081 Anemia of the puerperium: Secondary | ICD-10-CM | POA: Diagnosis present

## 2017-11-30 DIAGNOSIS — O4292 Full-term premature rupture of membranes, unspecified as to length of time between rupture and onset of labor: Principal | ICD-10-CM | POA: Diagnosis present

## 2017-11-30 DIAGNOSIS — Z98891 History of uterine scar from previous surgery: Secondary | ICD-10-CM

## 2017-11-30 DIAGNOSIS — Z3A37 37 weeks gestation of pregnancy: Secondary | ICD-10-CM

## 2017-11-30 DIAGNOSIS — O34219 Maternal care for unspecified type scar from previous cesarean delivery: Secondary | ICD-10-CM | POA: Diagnosis present

## 2017-11-30 DIAGNOSIS — O4290 Premature rupture of membranes, unspecified as to length of time between rupture and onset of labor, unspecified weeks of gestation: Secondary | ICD-10-CM | POA: Diagnosis present

## 2017-11-30 LAB — CBC WITH DIFFERENTIAL/PLATELET
Basophils Absolute: 0 10*3/uL (ref 0–0.1)
Basophils Relative: 1 %
Eosinophils Absolute: 0.2 10*3/uL (ref 0–0.7)
Eosinophils Relative: 2 %
HEMATOCRIT: 34.9 % — AB (ref 35.0–47.0)
HEMOGLOBIN: 11.9 g/dL — AB (ref 12.0–16.0)
LYMPHS ABS: 2 10*3/uL (ref 1.0–3.6)
LYMPHS PCT: 22 %
MCH: 28.5 pg (ref 26.0–34.0)
MCHC: 34.1 g/dL (ref 32.0–36.0)
MCV: 83.7 fL (ref 80.0–100.0)
Monocytes Absolute: 0.8 10*3/uL (ref 0.2–0.9)
Monocytes Relative: 8 %
NEUTROS ABS: 6.3 10*3/uL (ref 1.4–6.5)
NEUTROS PCT: 67 %
Platelets: 298 10*3/uL (ref 150–440)
RBC: 4.17 MIL/uL (ref 3.80–5.20)
RDW: 15.5 % — ABNORMAL HIGH (ref 11.5–14.5)
WBC: 9.3 10*3/uL (ref 3.6–11.0)

## 2017-11-30 LAB — TYPE AND SCREEN
ABO/RH(D): O POS
Antibody Screen: NEGATIVE

## 2017-11-30 SURGERY — Surgical Case
Anesthesia: Spinal | Wound class: Clean Contaminated

## 2017-11-30 MED ORDER — BUPIVACAINE LIPOSOME 1.3 % IJ SUSP
20.0000 mL | Freq: Once | INTRAMUSCULAR | Status: DC
  Filled 2017-11-30: qty 20

## 2017-11-30 MED ORDER — MEPERIDINE HCL 25 MG/ML IJ SOLN: 6.2500 mg | INTRAMUSCULAR | Status: DC | PRN

## 2017-11-30 MED ORDER — CEFAZOLIN SODIUM-DEXTROSE 2-4 GM/100ML-% IV SOLN
2.0000 g | INTRAVENOUS | Status: AC
  Administered 2017-11-30: 2 g via INTRAVENOUS
  Filled 2017-11-30: qty 100

## 2017-11-30 MED ORDER — SIMETHICONE 80 MG PO CHEW: 80.0000 mg | CHEWABLE_TABLET | ORAL | Status: DC | PRN

## 2017-11-30 MED ORDER — LIDOCAINE HCL (PF) 1 % IJ SOLN
INTRAMUSCULAR | Status: DC | PRN
  Administered 2017-11-30: 30 mL via SUBCUTANEOUS

## 2017-11-30 MED ORDER — KETOROLAC TROMETHAMINE 30 MG/ML IJ SOLN: 30.0000 mg | Freq: Once | INTRAMUSCULAR | Status: AC | PRN

## 2017-11-30 MED ORDER — ONDANSETRON HCL 4 MG/2ML IJ SOLN
INTRAMUSCULAR | Status: AC
  Filled 2017-11-30: qty 2

## 2017-11-30 MED ORDER — SODIUM CHLORIDE FLUSH 0.9 % IV SOLN
50.0000 mL | Freq: Once | INTRAVENOUS | Status: DC
Start: 2017-11-30 — End: 2017-11-30

## 2017-11-30 MED ORDER — MEASLES, MUMPS & RUBELLA VAC ~~LOC~~ INJ
0.5000 mL | INJECTION | Freq: Once | SUBCUTANEOUS | Status: DC
  Filled 2017-11-30: qty 0.5

## 2017-11-30 MED ORDER — PHENYLEPHRINE HCL 10 MG/ML IJ SOLN
INTRAVENOUS | Status: DC | PRN
  Administered 2017-11-30: 25 ug/min via INTRAVENOUS

## 2017-11-30 MED ORDER — PRENATAL MULTIVITAMIN CH
1.0000 | ORAL_TABLET | Freq: Every day | ORAL | Status: DC
  Administered 2017-11-30 – 2017-12-01 (×2): 1 via ORAL
  Filled 2017-11-30 (×2): qty 1

## 2017-11-30 MED ORDER — WITCH HAZEL-GLYCERIN EX PADS: 1.0000 "application " | MEDICATED_PAD | CUTANEOUS | Status: DC | PRN

## 2017-11-30 MED ORDER — BUPIVACAINE IN DEXTROSE 0.75-8.25 % IT SOLN
INTRATHECAL | Status: AC
  Filled 2017-11-30: qty 2

## 2017-11-30 MED ORDER — ACETAMINOPHEN 325 MG PO TABS: 325.0000 mg | ORAL_TABLET | ORAL | Status: DC | PRN

## 2017-11-30 MED ORDER — DEXAMETHASONE SODIUM PHOSPHATE 10 MG/ML IJ SOLN
INTRAMUSCULAR | Status: DC | PRN
  Administered 2017-11-30: 5 mg via INTRAVENOUS

## 2017-11-30 MED ORDER — SIMETHICONE 80 MG PO CHEW
80.0000 mg | CHEWABLE_TABLET | ORAL | Status: DC
  Administered 2017-12-01: 80 mg via ORAL
  Filled 2017-11-30: qty 1

## 2017-11-30 MED ORDER — ACETAMINOPHEN 160 MG/5ML PO SOLN
325.0000 mg | ORAL | Status: DC | PRN
  Filled 2017-11-30: qty 20.3

## 2017-11-30 MED ORDER — ONDANSETRON HCL 4 MG/2ML IJ SOLN
4.0000 mg | Freq: Once | INTRAMUSCULAR | Status: AC
  Administered 2017-11-30: 4 mg via INTRAVENOUS
  Filled 2017-11-30: qty 2

## 2017-11-30 MED ORDER — SODIUM CHLORIDE 0.9 % IV SOLN
500.0000 mg | Freq: Once | INTRAVENOUS | Status: AC
  Administered 2017-11-30: 500 mg via INTRAVENOUS
  Filled 2017-11-30: qty 500

## 2017-11-30 MED ORDER — LACTATED RINGERS IV SOLN
INTRAVENOUS | Status: DC | PRN
  Administered 2017-11-30: 08:00:00 via INTRAVENOUS

## 2017-11-30 MED ORDER — COCONUT OIL OIL: 1.0000 "application " | TOPICAL_OIL | Status: DC | PRN

## 2017-11-30 MED ORDER — FLEET ENEMA 7-19 GM/118ML RE ENEM: 1.0000 | ENEMA | Freq: Every day | RECTAL | Status: DC | PRN

## 2017-11-30 MED ORDER — OXYCODONE-ACETAMINOPHEN 5-325 MG PO TABS: 2.0000 | ORAL_TABLET | ORAL | Status: DC | PRN

## 2017-11-30 MED ORDER — DIBUCAINE 1 % RE OINT: 1.0000 "application " | TOPICAL_OINTMENT | RECTAL | Status: DC | PRN

## 2017-11-30 MED ORDER — IBUPROFEN 600 MG PO TABS
600.0000 mg | ORAL_TABLET | Freq: Four times a day (QID) | ORAL | Status: DC
  Administered 2017-11-30 – 2017-12-02 (×7): 600 mg via ORAL
  Filled 2017-11-30 (×9): qty 1

## 2017-11-30 MED ORDER — OXYCODONE-ACETAMINOPHEN 5-325 MG PO TABS
1.0000 | ORAL_TABLET | ORAL | Status: DC | PRN
  Administered 2017-12-01: 1 via ORAL
  Filled 2017-11-30: qty 1

## 2017-11-30 MED ORDER — FENTANYL CITRATE (PF) 100 MCG/2ML IJ SOLN
INTRAMUSCULAR | Status: AC
  Filled 2017-11-30: qty 2

## 2017-11-30 MED ORDER — ACETAMINOPHEN 325 MG PO TABS: 650.0000 mg | ORAL_TABLET | ORAL | Status: DC | PRN

## 2017-11-30 MED ORDER — SENNOSIDES-DOCUSATE SODIUM 8.6-50 MG PO TABS
2.0000 | ORAL_TABLET | ORAL | Status: DC
  Administered 2017-12-01: 2 via ORAL
  Filled 2017-11-30: qty 2

## 2017-11-30 MED ORDER — DIPHENHYDRAMINE HCL 25 MG PO CAPS: 25.0000 mg | ORAL_CAPSULE | Freq: Four times a day (QID) | ORAL | Status: DC | PRN

## 2017-11-30 MED ORDER — BUPIVACAINE HCL (PF) 0.5 % IJ SOLN
INTRAMUSCULAR | Status: DC | PRN
  Administered 2017-11-30: 30 mL

## 2017-11-30 MED ORDER — MORPHINE SULFATE (PF) 0.5 MG/ML IJ SOLN
INTRAMUSCULAR | Status: AC
  Filled 2017-11-30: qty 10

## 2017-11-30 MED ORDER — MENTHOL 3 MG MT LOZG: 1.0000 | LOZENGE | OROMUCOSAL | Status: DC | PRN

## 2017-11-30 MED ORDER — SOD CITRATE-CITRIC ACID 500-334 MG/5ML PO SOLN
30.0000 mL | ORAL | Status: AC
  Administered 2017-11-30: 30 mL via ORAL
  Filled 2017-11-30: qty 15

## 2017-11-30 MED ORDER — SIMETHICONE 80 MG PO CHEW
80.0000 mg | CHEWABLE_TABLET | Freq: Three times a day (TID) | ORAL | Status: DC
  Administered 2017-12-01 – 2017-12-02 (×3): 80 mg via ORAL
  Filled 2017-11-30 (×4): qty 1

## 2017-11-30 MED ORDER — KETOROLAC TROMETHAMINE 30 MG/ML IJ SOLN
INTRAMUSCULAR | Status: AC
  Filled 2017-11-30: qty 1

## 2017-11-30 MED ORDER — BUPIVACAINE LIPOSOME 1.3 % IJ SUSP
INTRAMUSCULAR | Status: DC | PRN
  Administered 2017-11-30: 70 mL

## 2017-11-30 MED ORDER — KETOROLAC TROMETHAMINE 30 MG/ML IJ SOLN
INTRAMUSCULAR | Status: DC | PRN
  Administered 2017-11-30: 30 mg via INTRAVENOUS

## 2017-11-30 MED ORDER — OXYTOCIN 40 UNITS IN LACTATED RINGERS INFUSION - SIMPLE MED
INTRAVENOUS | Status: DC | PRN
  Administered 2017-11-30: 1000 mL via INTRAVENOUS

## 2017-11-30 MED ORDER — BUPIVACAINE IN DEXTROSE 0.75-8.25 % IT SOLN
INTRATHECAL | Status: DC | PRN
  Administered 2017-11-30: 1.6 mL via INTRATHECAL

## 2017-11-30 MED ORDER — FENTANYL CITRATE (PF) 100 MCG/2ML IJ SOLN
INTRAMUSCULAR | Status: DC | PRN
  Administered 2017-11-30: 15 ug via INTRAVENOUS

## 2017-11-30 MED ORDER — DEXAMETHASONE SODIUM PHOSPHATE 10 MG/ML IJ SOLN
INTRAMUSCULAR | Status: AC
  Filled 2017-11-30: qty 1

## 2017-11-30 MED ORDER — BUPIVACAINE HCL (PF) 0.5 % IJ SOLN
30.0000 mL | Freq: Once | INTRAMUSCULAR | Status: DC
  Filled 2017-11-30: qty 30

## 2017-11-30 MED ORDER — OXYTOCIN 40 UNITS IN LACTATED RINGERS INFUSION - SIMPLE MED
INTRAVENOUS | Status: AC
  Filled 2017-11-30: qty 1000

## 2017-11-30 MED ORDER — BISACODYL 10 MG RE SUPP: 10.0000 mg | Freq: Every day | RECTAL | Status: DC | PRN

## 2017-11-30 MED ORDER — OXYTOCIN 40 UNITS IN LACTATED RINGERS INFUSION - SIMPLE MED
2.5000 [IU]/h | INTRAVENOUS | Status: AC
  Filled 2017-11-30: qty 1000

## 2017-11-30 MED ORDER — MORPHINE SULFATE (PF) 0.5 MG/ML IJ SOLN
INTRAMUSCULAR | Status: DC | PRN
  Administered 2017-11-30: .1 mg via EPIDURAL

## 2017-11-30 MED ORDER — LACTATED RINGERS IV SOLN
INTRAVENOUS | Status: DC
  Administered 2017-11-30 – 2017-12-01 (×2): via INTRAVENOUS

## 2017-11-30 MED ORDER — PROMETHAZINE HCL 25 MG/ML IJ SOLN: 6.2500 mg | INTRAMUSCULAR | Status: DC | PRN

## 2017-11-30 MED ORDER — TETANUS-DIPHTH-ACELL PERTUSSIS 5-2.5-18.5 LF-MCG/0.5 IM SUSP: 0.5000 mL | Freq: Once | INTRAMUSCULAR | Status: DC

## 2017-11-30 SURGICAL SUPPLY — 23 items
BARRIER ADHS 3X4 INTERCEED (GAUZE/BANDAGES/DRESSINGS) ×4 IMPLANT
CANISTER SUCT 3000ML PPV (MISCELLANEOUS) ×4 IMPLANT
CHLORAPREP W/TINT 26ML (MISCELLANEOUS) ×4 IMPLANT
DRSG TELFA 3X8 NADH (GAUZE/BANDAGES/DRESSINGS) ×4 IMPLANT
ELECT REM PT RETURN 9FT ADLT (ELECTROSURGICAL) ×4
ELECTRODE REM PT RTRN 9FT ADLT (ELECTROSURGICAL) ×2 IMPLANT
GAUZE SPONGE 4X4 12PLY STRL (GAUZE/BANDAGES/DRESSINGS) ×4 IMPLANT
GOWN STRL REUS W/ TWL LRG LVL3 (GOWN DISPOSABLE) ×6 IMPLANT
GOWN STRL REUS W/TWL LRG LVL3 (GOWN DISPOSABLE) ×6
NS IRRIG 1000ML POUR BTL (IV SOLUTION) ×4 IMPLANT
PAD OB MATERNITY 4.3X12.25 (PERSONAL CARE ITEMS) ×4 IMPLANT
PAD PREP 24X41 OB/GYN DISP (PERSONAL CARE ITEMS) ×4 IMPLANT
STAPLER INSORB 30 2030 C-SECTI (MISCELLANEOUS) ×4 IMPLANT
SUT MNCRL 4-0 (SUTURE) ×2
SUT MNCRL 4-0 27XMFL (SUTURE) ×2
SUT PDS AB 1 TP1 96 (SUTURE) ×8 IMPLANT
SUT PLAIN 2 0 XLH (SUTURE) ×8 IMPLANT
SUT PLAIN GUT 2-0 30 C14 SG823 (SUTURE)
SUT VIC AB 0 CT1 36 (SUTURE) ×8 IMPLANT
SUT VIC AB 3-0 SH 27 (SUTURE) ×2
SUT VIC AB 3-0 SH 27X BRD (SUTURE) ×2 IMPLANT
SUTURE MNCRL 4-0 27XMF (SUTURE) ×2 IMPLANT
SUTURE PLN GUT2-0 30 C14 SG823 (SUTURE) IMPLANT

## 2017-11-30 NOTE — Anesthesia Preprocedure Evaluation (Signed)
Anesthesia Evaluation  Patient identified by MRN, date of birth, ID band Patient awake    Reviewed: Allergy & Precautions, H&P , NPO status , reviewed documented beta blocker date and time   Airway Mallampati: II  TM Distance: >3 FB Neck ROM: full    Dental no notable dental hx. (+) Teeth Intact   Pulmonary neg pulmonary ROS,    Pulmonary exam normal        Cardiovascular negative cardio ROS Normal cardiovascular exam     Neuro/Psych negative neurological ROS  negative psych ROS   GI/Hepatic negative GI ROS, Neg liver ROS,   Endo/Other  negative endocrine ROS  Renal/GU      Musculoskeletal   Abdominal   Peds  Hematology negative hematology ROS (+)   Anesthesia Other Findings CBC reviewed  Reproductive/Obstetrics                             Anesthesia Physical Anesthesia Plan  ASA: II  Anesthesia Plan: Spinal   Post-op Pain Management:    Induction:   PONV Risk Score and Plan:   Airway Management Planned:   Additional Equipment:   Intra-op Plan:   Post-operative Plan:   Informed Consent: I have reviewed the patients History and Physical, chart, labs and discussed the procedure including the risks, benefits and alternatives for the proposed anesthesia with the patient or authorized representative who has indicated his/her understanding and acceptance.     Plan Discussed with: CRNA  Anesthesia Plan Comments:         Anesthesia Quick Evaluation

## 2017-11-30 NOTE — Discharge Summary (Signed)
Obstetrical Discharge Summary  Patient Name: Cheryl Fernandez DOB: 01/12/1986 MRN: [id number]  Date of Admission: 11/30/2017 Date of Discharge: 12/02/2017  Primary OB: [facility]  Gestational Age at Delivery: [redacted]w[redacted]d   Antepartum complications:  1. Previous C/S x 2 2. GBS Pos 3. Desires permanent sterilization; 09/21/17 4. UTI in preg 5. Anemia, taking Fe daily   Admitting Diagnosis: Premature rupture of membranes at 4am Secondary Diagnosis: Planned cesarean section and tubal ligation Patient Active Problem List   Diagnosis Date Noted  . Previous cesarean delivery, antepartum 11/30/2017  . Premature rupture of membranes (PROM) affecting third pregnancy 11/30/2017  . First trimester screening   . Liveborn by C-section 06/30/2015  . History of cesarean section, low transverse 06/29/2015    Complications: None Intrapartum complications/course:  Repeat cesarean section and BTL - uncomplicated   Date of Delivery: 11/30/17 Delivered By: Christeen Douglas Delivery Type: repeat cesarean section, low transverse incision Anesthesia: spinal Placenta: Spontaneous  Newborn Data: Live born female "Cheryl Fernandez" Birth Weight: 7 lb 14.6 oz (3590 g) APGAR: 8, 9  Newborn Delivery   Birth date/time:  11/30/2017 08:26:00 Delivery type:  C-Section, Low Transverse Trial of labor:  No C-section categorization:  Repeat     Discharge Physical Exam:  BP 107/61 (BP Location: Left Arm)   Pulse 64   Temp 98.5 F (36.9 C) (Oral)   Resp 17   Ht  (1.727 m)   Wt 93.4 kg (206 lb)   LMP 03/04/2017 (Approximate)   SpO2 99%   Breastfeeding? Unknown   BMI 31.32 kg/m   General: NAD CV: RRR Pulm: CTABL, nl effort ABD: s/nd/nt, fundus firm and below the umbilicus Lochia: moderate Incision: c/d/i  DVT Evaluation: LE non-ttp, no evidence of DVT on exam.  Hemoglobin  Date Value Ref Range Status  12/01/2017 10.5 (L) 12.0 - 16.0 g/dL Final  40/98/1191 47.8  Final   HCT  Date Value Ref Range  Status  12/01/2017 30.9 (L) 35.0 - 47.0 % Final  05/13/2017 37  Final    Post partum course: routine Postpartum Procedures: none Disposition: stable, discharge to home. Baby Feeding: breastmilk and ormula Baby Disposition: home with mom  Rh Immune globulin given: n/a Rubella vaccine given: n/a Tdap vaccine given in AP or PP setting: 09/21/17 Flu vaccine given in AP or PP setting: 05/12/17  Contraception: BTL  Prenatal Labs: Blood type/Rh --/--/O POS (05/06 0550)  Antibody screen neg  Rubella Immune  Varicella Immune  RPR NR  HBsAg Neg  HIV NR  GC neg  Chlamydia neg  Genetic screening negative  1 hour GTT 144  3 hour GTT 98-161-103-103  GBS positive     Plan:  Cheryl Fernandez was discharged to home in good condition. Follow-up appointment at [facility]  With Dr. Dalbert Garnet in 2 weeks   Discharge Medications: Allergies as of 12/02/2017   No Known Allergies     Medication List    TAKE these medications   acetaminophen 500 MG tablet Commonly known as:  TYLENOL Take 2 tablets (1,000 mg total) by mouth every 6 (six) hours.   ibuprofen 600 MG tablet Commonly known as:  ADVIL,MOTRIN Take 1 tablet (600 mg total) by mouth every 6 (six) hours.   oxyCODONE 5 MG immediate release tablet Commonly known as:  ROXICODONE Take 1 tablet (5 mg total) by mouth every 4 (four) hours as needed.   prenatal multivitamin Tabs tablet Take 1 tablet by mouth at bedtime.  Discharge Care Instructions  (From admission, onward)        Start     Ordered   12/02/17 0000  Discharge wound care:    Comments:  Keep incision dry, clean.   12/02/17 6295      Follow-up Information    Christeen Douglas, MD In 2 weeks.   Specialty:  Obstetrics and Gynecology Why:  For postop check Contact information: [address] [place] [address] [phone]           Signed: ----- Ranae Plumber, MD Attending Obstetrician and Gynecologist [facility], Department of OB/GYN [facility] 12/02/17

## 2017-11-30 NOTE — H&P (Signed)
OB History & Physical   History of Present Illness:  Chief Complaint: water broke  HPI:  Cheryl Fernandez is Fernandez 32 y.o. [id number] female at [redacted]w[redacted]d dated by [place] at 11+5.  She presents to L&D for PROM and elective repeat C/s  Active FM LOF  / SROM @ 0415; clear fluid UCs or bloody show: none    Pregnancy Issues: 1. Previous C/S x 2 2. GBS Pos 3. Desires permanent sterilization; 09/21/17 4. UTI in preg 5. Anemia, taking Fe daily   Maternal Medical History:  History reviewed. No pertinent past medical history.  Past Surgical History:  Procedure Laterality Date  . CESAREAN SECTION    . CESAREAN SECTION N/Fernandez 06/29/2015   Procedure: CESAREAN SECTION;  Surgeon: Ala Dach, MD;  Location: [facility] ORS;  Service: Obstetrics;  Laterality: N/Fernandez;  . CESAREAN SECTION N/Fernandez 11/23/2013  . CESAREAN SECTION  06/29/2015  . DILATION AND CURETTAGE OF UTERUS      No Known Allergies  Prior to Admission medications   Medication Sig Start Date End Date Taking? Authorizing Provider  Prenatal Vit-Fe Fumarate-FA (PRENATAL MULTIVITAMIN) TABS tablet Take 1 tablet by mouth at bedtime.    Yes [provider]     Prenatal care site: [facility]   Social History: She  reports that she has never smoked. She has never used smokeless tobacco. She reports that she does not drink alcohol or use drugs.  Family History: family history includes Diabetes in her father and mother; Hypertension in her father and mother.   Review of Systems: Fernandez full review of systems was performed and negative except as noted in the HPI.     Physical Exam:  Vital Signs: BP 113/70 (BP Location: Right Arm)   Pulse 85   Temp 98.4 F (36.9 C) (Oral)   Resp 16   LMP 03/04/2017 (Approximate)  General: no acute distress.  HEENT: normocephalic, atraumatic Heart: regular rate & rhythm.  No murmurs/rubs/gallops Lungs: clear to auscultation bilaterally, normal respiratory effort Abdomen: soft, gravid,  non-tender;  EFW: 7lbs Pelvic:   External: Normal external female genitalia  Cervix: Dilation: Fingertip / Effacement (%): Thick / Station: -3    Extremities: non-tender, symmetric, No edema bilaterally.  DTRs: 2+ Neurologic: Alert & oriented x 3.    No results found for this or any previous visit (from the past 24 hour(s)).  Pertinent Results:  Prenatal Labs: Blood type/Rh  O Pos  Antibody screen neg  Rubella Immune  Varicella Immune  RPR NR  HBsAg Neg  HIV NR  GC neg  Chlamydia neg  Genetic screening negative  1 hour GTT  144  3 hour GTT  98-161-103-103  GBS  Pos   FHT: 140bpm, mod variability, + accels, no decels.  TOCO: irregular uterine irritability SVE:  Dilation: Fingertip / Effacement (%): Thick / Station: -3    Cephalic by leopolds  No results found.  Assessment:  Cheryl Fernandez is Fernandez 32 y.o. [phone] female at [redacted]w[redacted]d with PROM and planned elective repeat C/S with permanent sterilization.    Plan:  1. Admit to Labor & Delivery; consents reviewed and obtained; Dr Dalbert Garnet notified of pt admission, orders received to proceed with planned elective rpt C/S.   2. Fetal Well being  - Fetal Tracing: Cat I - Group B Streptococcus ppx indicated: Pos - Presentation: cephalic confirmed by Leopolds   3. Routine OB: - Prenatal labs reviewed, as above - Rh: O Pos - CBC, T&S, RPR on admit - NPO,  IVF  4. The risks of cesarean section discussed with the patient included but were not limited to: bleeding which may require transfusion or reoperation; infection which may require antibiotics; injury to bowel, bladder, ureters or other surrounding organs; injury to the fetus; need for additional procedures including hysterectomy in the event of Fernandez life-threatening hemorrhage; placental abnormalities wth subsequent pregnancies, incisional problems, thromboembolic phenomenon and other postoperative/anesthesia complications. The patient concurred with the proposed plan, giving  informed written consent for the procedure.   Patient has been NPO since 2200,  she will remain NPO for procedure. Anesthesia and OR aware. Preoperative prophylactic antibiotics and SCDs ordered on call to the OR.  To OR when ready.  5. Post Partum Planning: - Infant feeding: Breast and bottle - Contraception: BTL  Cheryl Fernandez, CNM 11/30/17 6:05 AM

## 2017-11-30 NOTE — Anesthesia Post-op Follow-up Note (Signed)
Anesthesia QCDR form completed.        

## 2017-11-30 NOTE — Op Note (Signed)
Cesarean Section Procedure Note  Indications: previous uterine incision kerr x2 and desires sterilization  Pre-operative Diagnosis:  1. Intrauterine pregnancy at [redacted]w[redacted]d;  2. Desires permanent sterilization;  3. Premature rupture of membranes  Post-operative Diagnosis: same, delivered.  Procedure: 1. Repeat Low Transverse Cesarean Section through Pfannenstiel incision  2. Bilateral tubal sterilization using modified Parkland method  Surgeon: Christeen Douglas, MD  Assistant(s):  Heloise Ochoa, CNM  Anesthesia: Spinal anesthesia  Estimated Blood Loss:  400         Drains: none         Total IV Fluids:  Urine Output:         Specimens: Portion of left and right fallopian tubes; on right fimbrae were included, on left they were not         Complications:  None; patient tolerated the procedure well.         Disposition: PACU - hemodynamically stable.         Condition: stable  Findings:  A female infant "Candida" in cephalic presentation. Amniotic fluid - Clear  Birth weight 7#15oz.  Apgars of 8 and 9.   Intact placenta with a three-vessel cord.  Grossly normal uterus, tubes and ovaries bilaterally. Mild intraabdominal adhesions were noted.  Procedure Details  The patient was taken to Operating Room, identified as the correct patient and the procedure verified as C-Section Delivery. A Time Out was held and the above information confirmed.  After induction of anesthesia, the patient was draped and prepped in the usual sterile manner. A Pfannenstiel incision was made and carried down through the subcutaneous tissue to the fascia. Fascial incision was made and extended transversely with the Mayo scissors. The fascia was separated from the underlying rectus tissue superiorly and inferiorly. The peritoneum was identified and entered bluntly. Peritoneal incision was extended longitudinally. The utero-vesical peritoneal reflection was incised transversely and a bladder  flap was created digitally.   A low transverse hysterotomy was made. The fetus was delivered atraumatically. The umbilical cord was clamped x2 and cut after a 60 second delayed clampin and the infant was handed to the awaiting pediatricians. The placenta was removed intact and appeared normal with a 3-vessel cord.   The uterus was exteriorized and cleared of all clot and debris. The hysterotomy was closed with running sutures of  0-Vicryl. A second imbricating layer was placed with the same suture. Excellent hemostasis was observed.   Attention was then turned to the tubal ligation. The left fallopian tube distinguished from the round ligament by identifying the fimbria and was grasped with a Babcock clamp in the midisthmic portion approximately 3 cm from the cornual region. It was then doubly ligated with 0-plain gut suture in a Parkland fashion. The tubal segment was excised with Metzenbaum scissors. Tubal ostea noted. The procedure was repeated on the right side, and this side the fimbrea were included. *Care was noted to examine both tubal sites in situ to ensure the sutures were intact and no bleeding was noted. The uterus was returned to the abdomen.  The pelvis was irrigated and again, excellent hemostasis was noted. The fascia was then reapproximated with running sutures of 0 Vicryl.  The skin was reapproximated with Insorb absorbable staples.  Instrument, sponge, and needle counts were correct prior to the abdominal closure and at the conclusion of the case.   The patient tolerated the procedure well and was transferred to the recovery room in stable condition.   Christeen Douglas, MD5/12/2017

## 2017-11-30 NOTE — Progress Notes (Signed)
Pt ambulated to OR for c/s

## 2017-11-30 NOTE — Anesthesia Procedure Notes (Signed)
Spinal  Patient location during procedure: OR End time: 11/30/2017 7:58 AM Staffing Anesthesiologist: Alphonsus Sias, MD Resident/CRNA: Jonna Clark, CRNA Other anesthesia staff: Carver Fila, RN Performed: other anesthesia staff  Preanesthetic Checklist Completed: patient identified, site marked, surgical consent, pre-op evaluation, timeout performed, IV checked, risks and benefits discussed and monitors and equipment checked Spinal Block Patient position: sitting Prep: Betadine Patient monitoring: heart rate, continuous pulse ox, blood pressure and cardiac monitor Approach: midline Location: L4-5 Injection technique: single-shot Needle Needle type: Whitacre and Introducer  Needle gauge: 24 G Needle length: 9 cm Assessment Sensory level: T4 Additional Notes Negative paresthesia. Negative blood return. Positive free-flowing CSF. Expiration date of kit checked and confirmed. Patient tolerated procedure well, without complications.

## 2017-11-30 NOTE — Lactation Note (Signed)
This note was copied from a baby's chart. Lactation Consultation Note  Patient Name: Cheryl Fernandez [id number] Date: 11/30/2017 Reason for consult: Follow-up assessment;Early term 70-38.6wks Assisted mom with waking, positioning with pillow support and latching [name] to right breast.  Demonstrated hand expression and was able to get a few drops.  Encouraged breast feeding on demand.  This is mom's 3rd baby.  She breast fed first for 1 year and second for 10 months.  Mom has semi flat nipples with right areola firm and a little more difficult to compress.  Reggy Eye has wide open mouth latch and strong suck.  Once breast is sandwiched, she pulls it in deep enough to maintain latch and continue with strong, rhythmic sucking.  She reports using nipple shield with first for 6 months and second for 3 months.  She is so excited that [name] can latch without nipple shield.  Stressed importance of deep latch to prevent sore nipples which she seems to be able to sustain.  Reviewed normal course of lactation and routine newborn feeding patterns.  Mom chose breast/bottle on admission.  Discouraged mom from giving bottles until breast feeding was well established and mature milk was in to prevent nipple confusion and to bring mature milk in and ensure a plentiful milk supply without having to use nipple shield.  Lactation name and number written on white board and encouraged to call for questions, concerns or assistance.  Maternal Data Formula Feeding for Exclusion: No Has patient been taught Hand Expression?: Yes Does the patient have breastfeeding experience prior to this delivery?: Yes  Feeding Feeding Type: Breast Fed Length of feed: 15 min  LATCH Score Latch: Repeated attempts needed to sustain latch, nipple held in mouth throughout feeding, stimulation needed to elicit sucking reflex.  Audible Swallowing: A few with stimulation  Type of Nipple: Flat(Semi flat (Rt more than lt), but  [name] has strong suck )  Comfort (Breast/Nipple): Soft / non-tender  Hold (Positioning): Assistance needed to correctly position infant at breast and maintain latch.  LATCH Score: 6  Interventions Interventions: Assisted with latch;Breast massage;Breast compression;Support pillows;Position options  Lactation Tools Discussed/Used WIC Program: Yes   Consult Status Consult Status: Follow-up Date: 11/30/17 Follow-up type: Call as needed    Louis Meckel 11/30/2017, 1:12 PM

## 2017-11-30 NOTE — OB Triage Note (Signed)
Pt present to L&D with c/o SROM at 0415. Pt denies decreased fetal movement, vaginal bleeding, or contractions at this time. Toco and EFM applied, will monitor closely.

## 2017-11-30 NOTE — Consult Note (Signed)
Neonatology Note:   Attendance at C-section:    I was asked by Dr. Dalbert Garnet to attend this repeat C/S at term. The mother is a 32 y.o. [phone] female at [redacted]w[redacted]d dated by [place] at 11+5.  She presents to L&D for PROM and elective repeat C/s, GBS + with Azith and Ancef just PTD.  Good prenatal care. ROM 4 hours before delivery, fluid clear. Infant vigorous with good spontaneous cry and tone.  +60 sec DCC.  Needed only minimal bulb suctioning. Ap 8/9. Lungs clear to ausc in DR.  Introduced to parents and they were updated.  To CN to care of Pediatrician.  Dineen Kid Leary Roca, MD

## 2017-11-30 NOTE — Transfer of Care (Signed)
Immediate Anesthesia Transfer of Care Note  Patient: Cheryl Fernandez  Procedure(s) Performed: CESAREAN SECTION WITH BILATERAL TUBAL LIGATION (Bilateral )  Patient Location: Mother/Baby  Anesthesia Type:Spinal  Level of Consciousness: awake, alert  and oriented  Airway & Oxygen Therapy: Patient Spontanous Breathing  Post-op Assessment: Report given to RN and Post -op Vital signs reviewed and stable  Post vital signs: Reviewed and stable  Last Vitals:  Vitals Value Taken Time  BP 106/56 11/30/2017  9:20 AM  Temp 36.6 C 11/30/2017  9:15 AM  Pulse 76 11/30/2017  9:20 AM  Resp 16 11/30/2017  9:20 AM  SpO2 99 % 11/30/2017  9:20 AM    Last Pain:  Vitals:   11/30/17 0915  TempSrc: Oral  PainSc:          Complications: No apparent anesthesia complications

## 2017-11-30 NOTE — Plan of Care (Signed)
Plan of care reviewed with patient. All questions answered. Will monitor closely.

## 2017-11-30 NOTE — Progress Notes (Signed)
32 y.o. [id number] female at [redacted]w[redacted]d dated by [place] at 11+5.  She presents to L&D for PROM and elective repeat C/s with a plan for sterilization.  The risks of cesarean section discussed with the patient included but were not limited to: bleeding which may require transfusion or reoperation; infection which may require antibiotics; injury to bowel, bladder, ureters or other surrounding organs; injury to the fetus; need for additional procedures including hysterectomy in the event of a life-threatening hemorrhage; placental abnormalities wth subsequent pregnancies, incisional problems, thromboembolic phenomenon and other postoperative/anesthesia complications. The patient concurred with the proposed plan, giving informed written consent for the procedure.   Patient has been NPO since  she will remain NPO for procedure. Anesthesia and OR aware. Preoperative prophylactic antibiotics and SCDs ordered on call to the OR.  To OR when ready.   Patient desires permanent sterilization.  Other reversible forms of contraception were discussed with patient; she declines all other modalities. Risks of procedure discussed with patient including but not limited to: risk of regret, permanence of method, bleeding, infection, injury to surrounding organs and need for additional procedures.  Failure risk of 1-2 % with increased risk of ectopic gestation if pregnancy occurs was also discussed with patient.  Patient verbalized understanding of these risks and wants to proceed with sterilization.  Written informed consent obtained.  To OR when ready.

## 2017-11-30 NOTE — Anesthesia Postprocedure Evaluation (Signed)
Anesthesia Post Note  Patient: Cheryl Fernandez  Procedure(s) Performed: CESAREAN SECTION WITH BILATERAL TUBAL LIGATION (Bilateral )  Patient location during evaluation: Mother Baby Anesthesia Type: Spinal Level of consciousness: awake and alert Pain management: pain level controlled Vital Signs Assessment: post-procedure vital signs reviewed and stable Respiratory status: spontaneous breathing, nonlabored ventilation and respiratory function stable Cardiovascular status: stable Postop Assessment: no headache, no backache and spinal receding Anesthetic complications: no     Last Vitals:  Vitals:   11/30/17 1215 11/30/17 1330  BP: 112/69 104/64  Pulse: 62 63  Resp: 18 18  Temp: 37.1 C 36.9 C  SpO2: 99% 99%    Last Pain:  Vitals:   11/30/17 1330  TempSrc: Oral  PainSc:                  [name]

## 2017-12-01 LAB — CBC
HCT: 30.9 % — ABNORMAL LOW (ref 35.0–47.0)
HEMOGLOBIN: 10.5 g/dL — AB (ref 12.0–16.0)
MCH: 28.5 pg (ref 26.0–34.0)
MCHC: 33.9 g/dL (ref 32.0–36.0)
MCV: 84 fL (ref 80.0–100.0)
Platelets: 249 10*3/uL (ref 150–440)
RBC: 3.67 MIL/uL — ABNORMAL LOW (ref 3.80–5.20)
RDW: 15.3 % — ABNORMAL HIGH (ref 11.5–14.5)
WBC: 10.8 10*3/uL (ref 3.6–11.0)

## 2017-12-01 LAB — RPR: RPR Ser Ql: NONREACTIVE

## 2017-12-01 LAB — SURGICAL PATHOLOGY

## 2017-12-01 MED ORDER — FERROUS SULFATE 325 (65 FE) MG PO TABS
325.0000 mg | ORAL_TABLET | Freq: Every day | ORAL | Status: DC
  Administered 2017-12-01 – 2017-12-02 (×2): 325 mg via ORAL
  Filled 2017-12-01 (×2): qty 1

## 2017-12-01 NOTE — Plan of Care (Signed)
Vital signs stable; IV fluids continued; IV site clear; fundus firm; scant rubra lochia; breast and bottle feeding with good technique observed; good maternal-infant bonding observed; voiding; ambulating; good po fluids; good appetite; pain controlled with po motrin; nurse instructed patient on s/s incision infection and patient v/u of same.

## 2017-12-01 NOTE — Progress Notes (Signed)
Post Op Day 1  Subjective: Doing well, no concerns. Ambulating without difficulty, pain managed with PO meds, tolerating regular diet, and voiding without difficulty.   No fever/chills, chest pain, shortness of breath, nausea/vomiting, or leg pain. No nipple or breast pain.   Objective: BP 108/63 (BP Location: Left Arm)   Pulse 66   Temp 98.3 F (36.8 C) (Oral)   Resp 18   Ht  (1.727 m)   Wt 93.4 kg (206 lb)   LMP 03/04/2017 (Approximate)   SpO2 98%   Breastfeeding? Unknown   BMI 31.32 kg/m    Physical Exam:  General: alert, cooperative, appears stated age and no distress Breasts: soft/nontender CV: RRR Pulm: nl effort, CTABL Abdomen: soft, non-tender, active bowel sounds Uterine Fundus: firm Incision: healing well, no significant drainage, no dehiscence, no significant erythema Lochia: appropriate DVT Evaluation: No evidence of DVT seen on physical exam. No cords or calf tenderness. No significant calf/ankle edema.  Recent Labs    11/30/17 0550 12/01/17 0648  HGB 11.9* 10.5*  HCT 34.9* 30.9*  WBC 9.3 10.8  PLT 298 249    Assessment/Plan: 32 y.o. [id number] postop day # 1  -Continue routine PP care -Lactation consult PRN for breastfeeding.  -Acute blood loss anemia - hemodynamically stable and asymptomatic; start po ferrous sulfate daily with stool softeners   Disposition: Continue inpatient postpartum care, likely discharge tomorrow.    LOS: 1 day   Genia Del, CNM 12/01/2017, 8:19 AM   ----- Genia Del Certified Nurse Midwife [facility]

## 2017-12-01 NOTE — Progress Notes (Signed)
Pt and pt's sister viewed the DVD, "The Period of Purple Cry". DVD copy given to pt for discharge home for other family members to view at home as well.

## 2017-12-01 NOTE — Lactation Note (Signed)
This note was copied from a baby's chart. Lactation Consultation Note  Patient Name: Cheryl Fernandez RUEAV'W Date: 12/01/2017 Reason for consult: Follow-up assessment  Baby seems to have been getting just a shallow latch and falling asleep at beast, so MOm has been trying to BF a few minutes today and then bottle feeds about 10 ml. This afternoon, she was very fussy. RN helped with position and latch and some pre-pumping. When that didn't work she used formula in curved tip syringe to elicit latch. It worked for brief moments, but Sport and exercise psychologist. After hearing that she used nipple shield with her other babies and seeing her become frustrated, I tried 20 mm nipple and inserted some formula into shield. That worked the best so far according to mom. Baby developed more strong, rhythmic suck and swallow pattern. She only took in 3 ml when I left after about 10 more minutes of nursing.   Maternal Data    Feeding Feeding Type: Breast Milk with Formula added  LATCH Score Latch: Repeated attempts needed to sustain latch, nipple held in mouth throughout feeding, stimulation needed to elicit sucking reflex.(2- with a 20 mm nipple shield only)  Audible Swallowing: A few with stimulation  Type of Nipple: Flat  Comfort (Breast/Nipple): Soft / non-tender  Hold (Positioning): Assistance needed to correctly position infant at breast and maintain latch.  LATCH Score: 6  Interventions Interventions: Breast feeding basics reviewed;Assisted with latch;Skin to skin;Breast massage;Pre-pump if needed;Breast compression;Adjust position;Support pillows;Position options;Expressed milk  Lactation Tools Discussed/Used     Consult Status Consult Status: Follow-up Date: 12/02/17    Sunday Corn 12/01/2017, 5:23 PM

## 2017-12-02 MED ORDER — ACETAMINOPHEN 500 MG PO TABS: 1000.0000 mg | ORAL_TABLET | Freq: Four times a day (QID) | ORAL | 1 refills | Status: AC

## 2017-12-02 MED ORDER — OXYCODONE HCL 5 MG PO TABS: 5.0000 mg | ORAL_TABLET | ORAL | 0 refills | Status: AC | PRN

## 2017-12-02 MED ORDER — IBUPROFEN 600 MG PO TABS: 600.0000 mg | ORAL_TABLET | Freq: Four times a day (QID) | ORAL | 1 refills | Status: AC

## 2017-12-02 NOTE — Progress Notes (Signed)
Patient discharged home with infant. Discharge instructions and prescriptions given and reviewed with patient. Patient verbalized understanding. Escorted out by auxillary.  

## 2017-12-02 NOTE — Discharge Instructions (Signed)
Discharge instructions:   Call office if you have any of the following: headache, visual changes, fever >101.0 F, chills, breast concerns, excessive vaginal bleeding, incision drainage or problems, leg pain or redness, depression or any other concerns.   Activity: Do not lift > 10 lbs for 6 weeks.  No intercourse or tampons for 6 weeks.  No driving for 1-2 weeks.   Call your doctor for increased pain or vaginal bleeding, temperature above 101.0, depression, or concerns.  No strenuous activity or heavy lifting for 6 weeks.  No intercourse, tampons, douching, or enemas for 6 weeks.  No tub baths-showers only.  No driving for 2 weeks or while taking pain medications.  Continue prenatal vitamin and iron.  Increase calories and fluids while breastfeeding.  You may have a slight fever when your milk comes in, but it should go away on its own.  If it does not, and rises above 101.0 please call the doctor.  For concerns about your baby, please call your pediatrician For breastfeeding concerns, the lactation consultant can be reached at [phone]  Take  Tylenol and  Ibuprofen every 6 hours for pain control, at least for the first 3-5 days while home.  After that you can consider taking them as needed.  Take the Oxycodone as needed.  Please remember that the Oxycodone does not treat the source of your pain, just tricks your brain to think it is not in pain.      Cesarean Delivery, Care After Refer to this sheet in the next few weeks. These instructions provide you with information about caring for yourself after your procedure. Your health care provider may also give you more specific instructions. Your treatment has been planned according to current medical practices, but problems sometimes occur. Call your health care provider if you have any problems or questions after your procedure. What can I expect after the procedure? After the procedure, it is common to have:  A small amount of  blood or clear fluid coming from the incision.  Some redness, swelling, and pain in your incision area.  Some abdominal pain and soreness.  Vaginal bleeding (lochia).  Pelvic cramps.  Fatigue.  Follow these instructions at home: Incision care   Follow instructions from your health care provider about how to take care of your incision. Make sure you: ? Wash your hands with soap and water before you change your bandage (dressing). If soap and water are not available, use hand sanitizer. ? If you have a dressing, change it as told by your health care provider. ? Leave stitches (sutures), skin staples, skin glue, or adhesive strips in place. These skin closures may need to stay in place for 2 weeks or longer. If adhesive strip edges start to loosen and curl up, you may trim the loose edges. Do not remove adhesive strips completely unless your health care provider tells you to do that.  Check your incision area every day for signs of infection. Check for: ? More redness, swelling, or pain. ? More fluid or blood. ? Warmth. ? Pus or a bad smell.  When you cough or sneeze, hug a pillow. This helps with pain and decreases the chance of your incision opening up (dehiscing). Do this until your incision heals. Medicines  Take over-the-counter and prescription medicines only as told by your health care provider.  If you were prescribed an antibiotic medicine, take it as told by your health care provider. Do not stop taking the antibiotic until it is  finished. Driving  Do not drive or operate heavy machinery while taking prescription pain medicine. Lifestyle  Do not drink alcohol. This is especially important if you are breastfeeding or taking pain medicine.  Do not use tobacco products, including cigarettes, chewing tobacco, or e-cigarettes. If you need help quitting, ask your health care provider. Tobacco can delay wound healing. Eating and drinking  Drink at least 8 eight-ounce  glasses of water every day unless told not to by your health care provider. If you breastfeed, you may need to drink more water than this.  Eat high-fiber foods every day. These foods may help prevent or relieve constipation. High-fiber foods include: ? Whole grain cereals and breads. ? Brown rice. ? Beans. ? Fresh fruits and vegetables. Activity  Return to your normal activities as told by your health care provider. Ask your health care provider what activities are safe for you.  Rest as much as possible. Try to rest or take a nap while your baby is sleeping.  Do not lift anything that is heavier than your baby or 10 lb (4.5 kg) as told by your health care provider.  Ask your health care provider when you can engage in sexual activity. This may depend on your: ? Risk of infection. ? Healing rate. ? Comfort and desire to engage in sexual activity. Bathing  Do not take baths, swim, or use a hot tub until your health care provider approves. Ask your health care provider if you can take showers. You may only be allowed to take sponge baths until your incision heals. General instructions  Do not use tampons or douches until your health care provider approves.  Wear: ? Loose, comfortable clothing. ? A supportive and well-fitting bra.  Watch for any blood clots that may pass from your vagina. These may look like clumps of dark red, brown, or black discharge.  Keep your perineum clean and dry as told by your health care provider.  Wipe from front to back when you use the toilet.  If possible, have someone help you care for your baby and help with household activities for a few days after you leave the hospital.  Keep all follow-up visits for you and your baby as told by your health care provider. This is important. Contact a health care provider if:  You have: ? Bad-smelling vaginal discharge. ? Difficulty urinating. ? Pain when urinating. ? A sudden increase or decrease in the  frequency of your bowel movements. ? More redness, swelling, or pain around your incision. ? More fluid or blood coming from your incision. ? Pus or a bad smell coming from your incision. ? A fever. ? A rash. ? Little or no interest in activities you used to enjoy. ? Questions about caring for yourself or your baby. ? Nausea.  Your incision feels warm to the touch.  Your breasts turn red or become painful or hard.  You feel unusually sad or worried.  You vomit.  You pass large blood clots from your vagina. If you pass a blood clot, save it to show to your health care provider. Do not flush blood clots down the toilet without showing your health care provider.  You urinate more than usual.  You are dizzy or light-headed.  You have not breastfed and have not had a menstrual period for 12 weeks after delivery.  You stopped breastfeeding and have not had a menstrual period for 12 weeks after stopping breastfeeding. Get help right away if:  You  have: ? Pain that does not go away or get better with medicine. ? Chest pain. ? Difficulty breathing. ? Blurred vision or spots in your vision. ? Thoughts about hurting yourself or your baby. ? New pain in your abdomen or in one of your legs. ? A severe headache.  You faint.  You bleed from your vagina so much that you fill two sanitary pads in one hour. This information is not intended to replace advice given to you by your health care provider. Make sure you discuss any questions you have with your health care provider. Document Released: 04/05/2002 Document Revised: 08/16/2016 Document Reviewed: 06/18/2015 Elsevier Interactive Patient Education  [employer].

## 2017-12-08 ENCOUNTER — Inpatient Hospital Stay: Admission: RE | Admit: 2017-12-08 | Payer: Medicaid Other | Source: Ambulatory Visit

## 2017-12-09 ENCOUNTER — Encounter: Admission: RE | Payer: Self-pay | Source: Ambulatory Visit

## 2017-12-09 ENCOUNTER — Inpatient Hospital Stay
Admission: RE | Admit: 2017-12-09 | Payer: Medicaid Other | Source: Ambulatory Visit | Admitting: Obstetrics and Gynecology

## 2017-12-09 SURGERY — Surgical Case
Anesthesia: Spinal | Laterality: Bilateral

## 2019-03-23 IMAGING — US US MFM FETAL NUCHAL TRANSLUCENCY
1 series · 14 of 28 positions shown · non-contrast
Comparison: none

[Series 1: us mfm fetal nuchal translucency · 0.19mm/px · 14 of 42 slices shown]
[im 2/42]
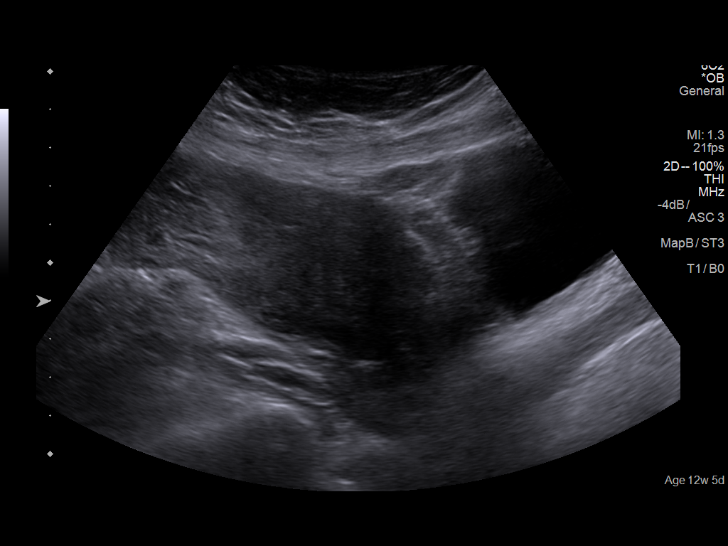
[im 5/42]
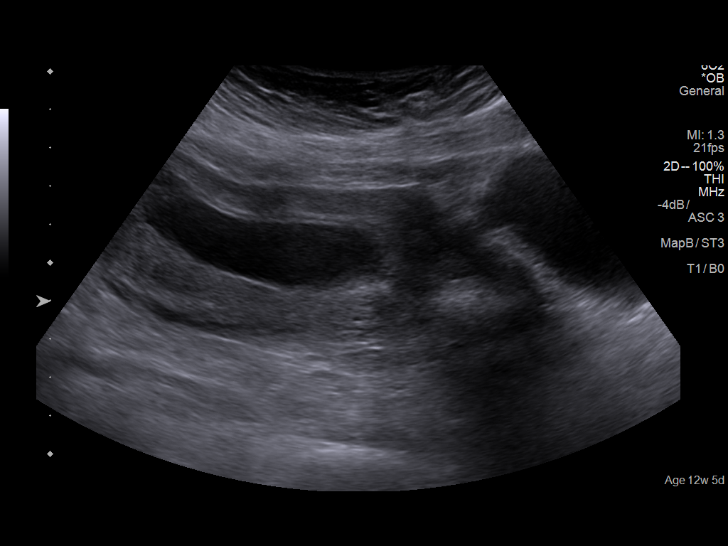
[im 8/42]
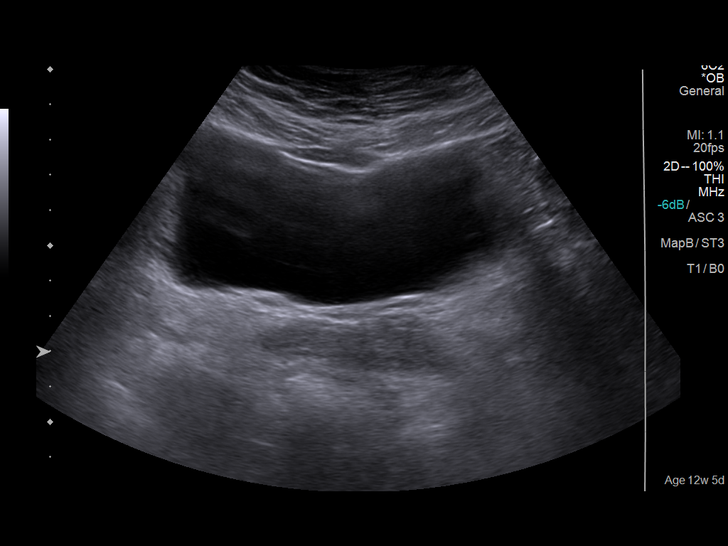
[im 11/42]
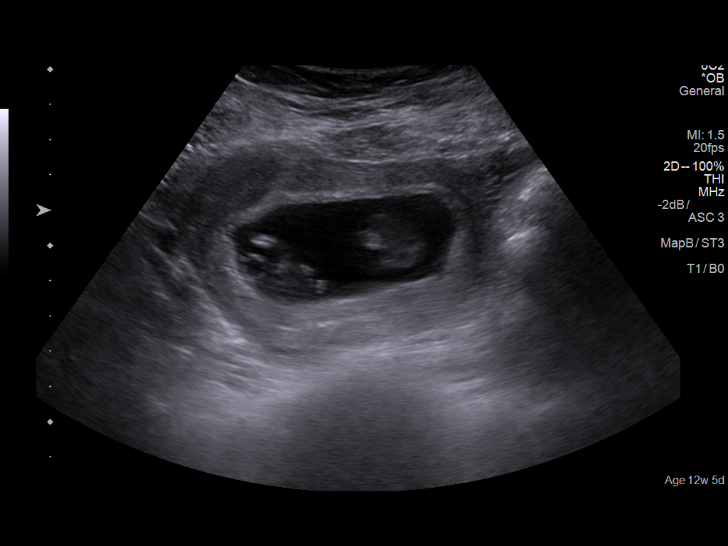
[im 14/42]
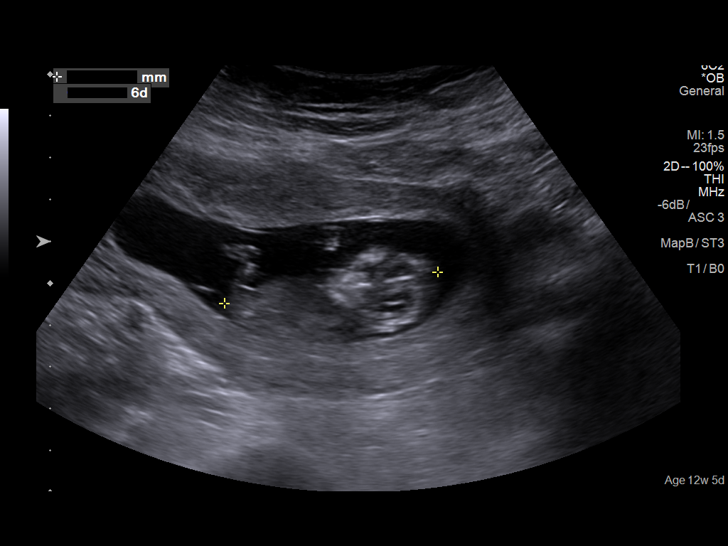
[im 17/42]
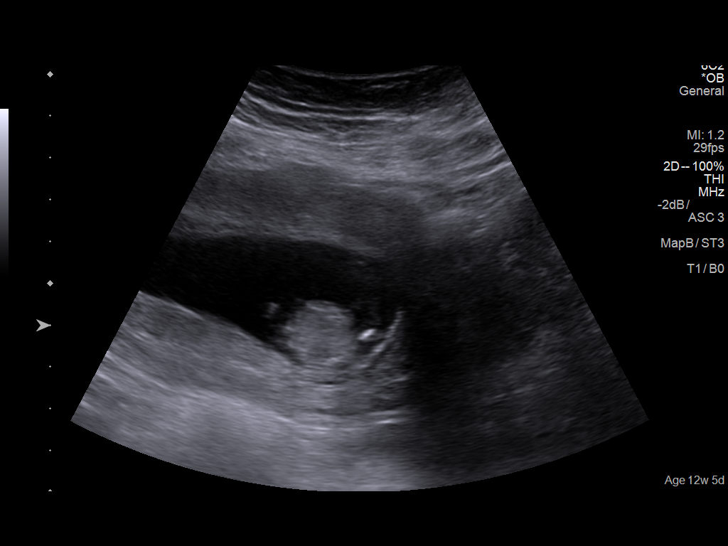
[im 20/42]
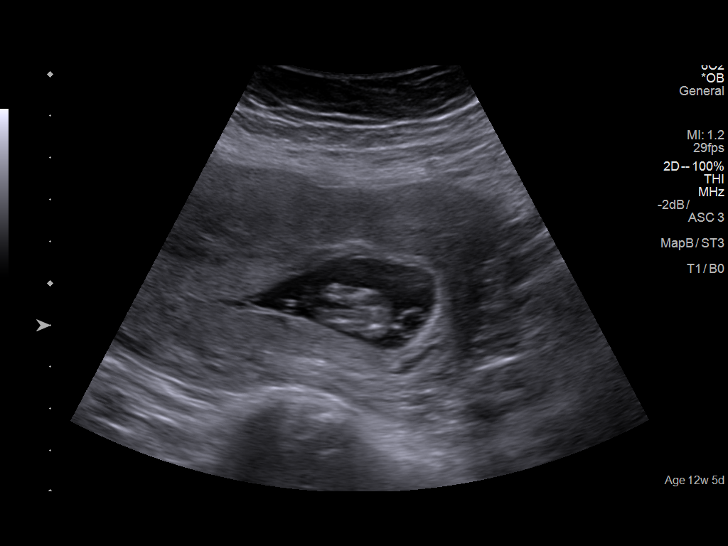
[im 23/42]
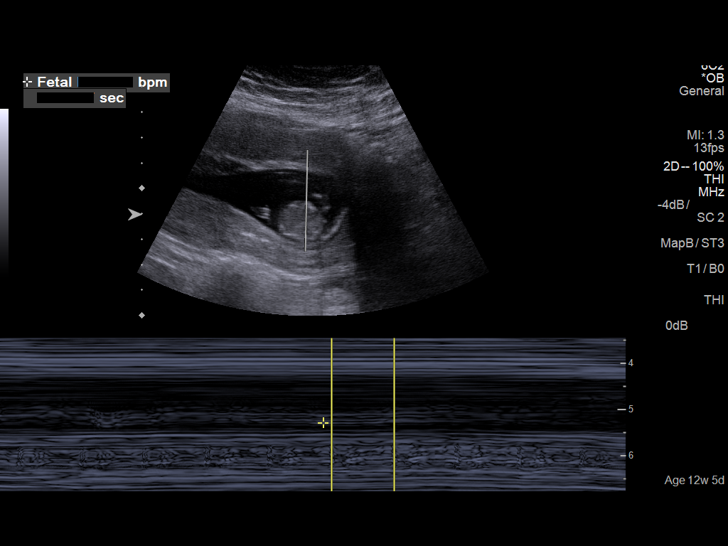
[im 26/42]
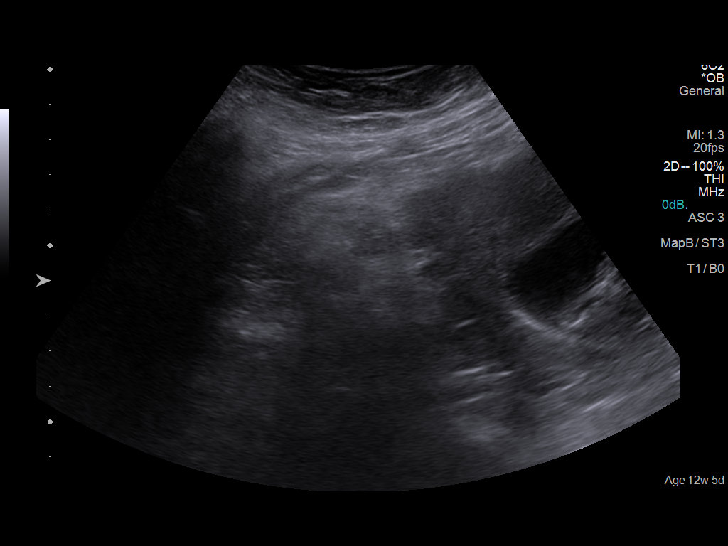
[im 29/42]
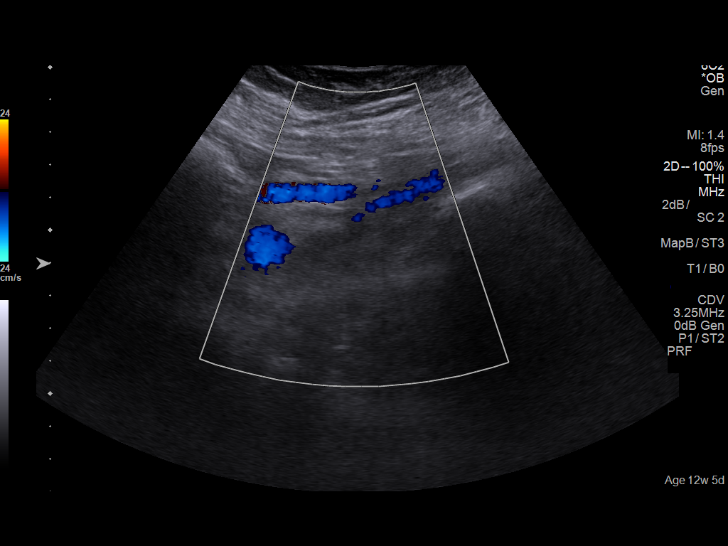
[im 32/42]
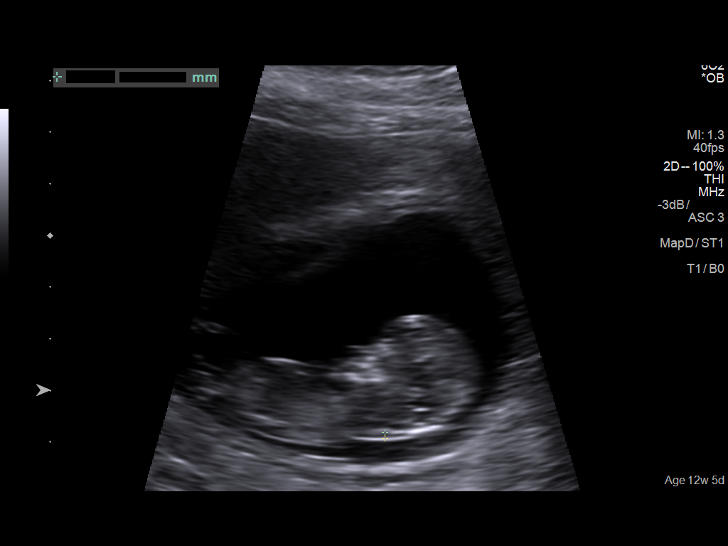
[im 35/42]
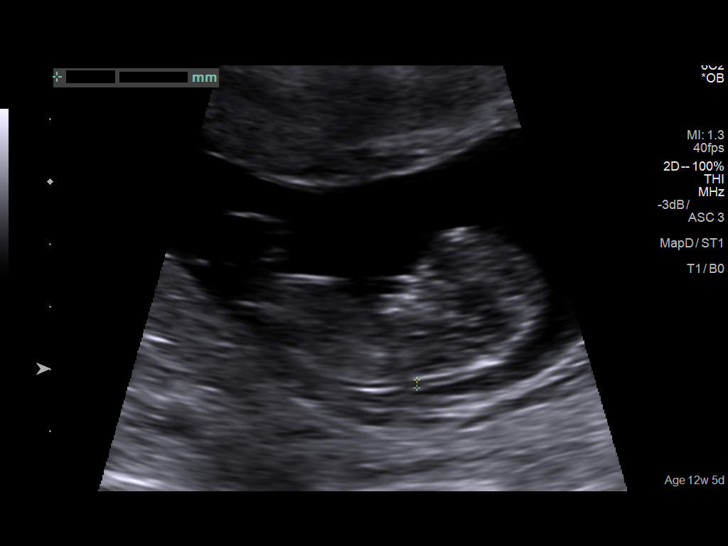
[im 38/42]
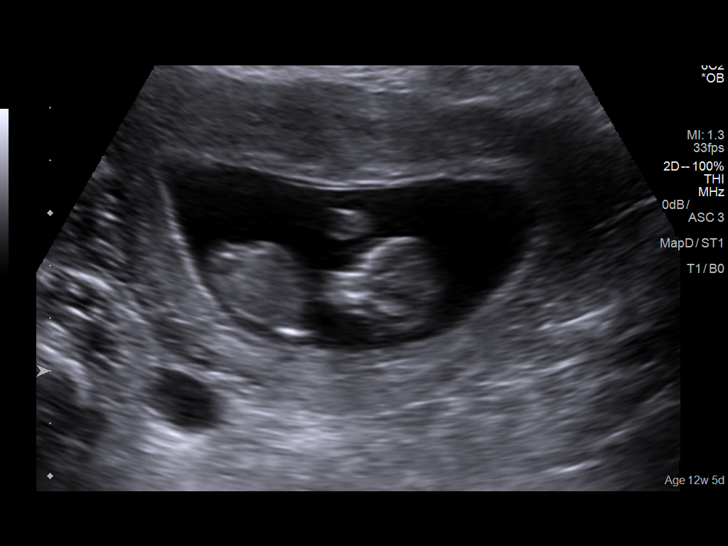
[im 42/42]
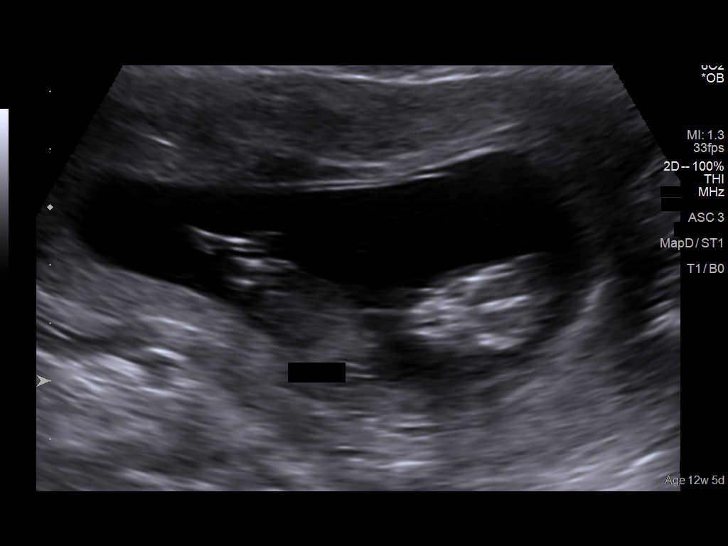

[14 of 28 positions shown; findings below may reference images not displayed]

Canned report from images found in remote index.

Refer to host system for actual result text.

## 2019-10-23 ENCOUNTER — Ambulatory Visit: Payer: Medicaid Other | Attending: Internal Medicine

## 2019-10-23 DIAGNOSIS — Z23 Encounter for immunization: Secondary | ICD-10-CM

## 2019-10-23 NOTE — Progress Notes (Signed)
   Covid-19 Vaccination Clinic  Name:  Cheryl Fernandez    MRN: [id number] DOB: 1985-12-23  10/23/2019  Cheryl Fernandez was observed post Covid-19 immunization for 15 minutes without incident. She was provided with Vaccine Information Sheet and instruction to access the V-Safe system.   Ms. Don was instructed to call 911 with any severe reactions post vaccine: Marland Kitchen Difficulty breathing  . Swelling of face and throat  . A fast heartbeat  . A bad rash all over body  . Dizziness and weakness   Immunizations Administered    Name Date Dose VIS Date Route   Pfizer COVID-19 Vaccine 10/23/2019 11:06 AM 0.3 mL 07/08/2019 Intramuscular   Manufacturer: [employer], [employer]   Lot: [id number]   NDC: [address]-7

## 2019-11-13 ENCOUNTER — Ambulatory Visit: Payer: Medicaid Other | Attending: Internal Medicine

## 2019-11-13 DIAGNOSIS — Z23 Encounter for immunization: Secondary | ICD-10-CM

## 2019-11-13 NOTE — Progress Notes (Signed)
   Covid-19 Vaccination Clinic  Name:  Cheryl Fernandez    MRN: [id number] DOB: 21-Jun-1986  11/13/2019  [name] was observed post Covid-19 immunization for 15 minutes without incident. She was provided with Vaccine Information Sheet and instruction to access the V-Safe system.   [name] was instructed to call 911 with any severe reactions post vaccine: Marland Kitchen Difficulty breathing  . Swelling of face and throat  . A fast heartbeat  . A bad rash all over body  . Dizziness and weakness   Immunizations Administered    Name Date Dose VIS Date Route   Pfizer COVID-19 Vaccine 11/13/2019 10:16 AM 0.3 mL 07/08/2019 Intramuscular   Manufacturer: [employer], [employer]   Lot: [id number]   NDC: [address]-0

## 2020-01-02 ENCOUNTER — Encounter: Payer: Self-pay | Admitting: Emergency Medicine

## 2020-01-02 ENCOUNTER — Emergency Department
Admission: EM | Admit: 2020-01-02 | Discharge: 2020-01-02 | Disposition: A | Discharge status: Home / Self Care | Payer: Medicaid Other | Attending: Emergency Medicine | Admitting: Emergency Medicine

## 2020-01-02 ENCOUNTER — Emergency Department: Payer: Medicaid Other

## 2020-01-02 ENCOUNTER — Other Ambulatory Visit: Payer: Self-pay

## 2020-01-02 DIAGNOSIS — R1031 Right lower quadrant pain: Secondary | ICD-10-CM | POA: Diagnosis present

## 2020-01-02 LAB — COMPREHENSIVE METABOLIC PANEL
ALT: 14 U/L (ref 0–44)
AST: 14 U/L — ABNORMAL LOW (ref 15–41)
Albumin: 4.2 g/dL (ref 3.5–5.0)
Alkaline Phosphatase: 79 U/L (ref 38–126)
Anion gap: 8 (ref 5–15)
BUN: 10 mg/dL (ref 6–20)
CO2: 24 mmol/L (ref 22–32)
Calcium: 8.7 mg/dL — ABNORMAL LOW (ref 8.9–10.3)
Chloride: 103 mmol/L (ref 98–111)
Creatinine, Ser: 0.56 mg/dL (ref 0.44–1.00)
GFR calc Af Amer: >60 mL/min (ref >60)
GFR calc non Af Amer: >60 mL/min (ref >60)
Glucose, Bld: 100 mg/dL — ABNORMAL HIGH (ref 70–99)
Potassium: 4 mmol/L (ref 3.5–5.1)
Sodium: 135 mmol/L (ref 135–145)
Total Bilirubin: 0.9 mg/dL (ref 0.3–1.2)
Total Protein: 7.5 g/dL (ref 6.5–8.1)

## 2020-01-02 LAB — URINALYSIS, COMPLETE (UACMP) WITH MICROSCOPIC
Bacteria, UA: NONE SEEN
Bilirubin Urine: NEGATIVE
Glucose, UA: NEGATIVE mg/dL
Ketones, ur: 5 mg/dL — AB
Leukocytes,Ua: NEGATIVE
Nitrite: NEGATIVE
Protein, ur: NEGATIVE mg/dL
Specific Gravity, Urine: 1.001 — ABNORMAL LOW (ref 1.005–1.030)
Squamous Epithelial / LPF: NONE SEEN (ref 0–5)
pH: 6 (ref 5.0–8.0)

## 2020-01-02 LAB — CBC
HCT: 37.6 % (ref 36.0–46.0)
Hemoglobin: 12.4 g/dL (ref 12.0–15.0)
MCH: 28.8 pg (ref 26.0–34.0)
MCHC: 33 g/dL (ref 30.0–36.0)
MCV: 87.2 fL (ref 80.0–100.0)
Platelets: 289 10*3/uL (ref 150–400)
RBC: 4.31 MIL/uL (ref 3.87–5.11)
RDW: 12.5 % (ref 11.5–15.5)
WBC: 7.4 10*3/uL (ref 4.0–10.5)
nRBC: 0 % (ref 0.0–0.2)

## 2020-01-02 LAB — LIPASE, BLOOD: Lipase: 23 U/L (ref 11–51)

## 2020-01-02 LAB — POC URINE PREG, ED: Preg Test, Ur: NEGATIVE

## 2020-01-02 MED ORDER — DICYCLOMINE HCL 10 MG PO CAPS: 10.0000 mg | ORAL_CAPSULE | Freq: Three times a day (TID) | ORAL | 0 refills | Status: AC | PRN

## 2020-01-02 MED ORDER — SODIUM CHLORIDE 0.9% FLUSH: 3.0000 mL | Freq: Once | INTRAVENOUS | Status: DC

## 2020-01-02 NOTE — ED Notes (Signed)
Pt transported to CT ?

## 2020-01-02 NOTE — ED Triage Notes (Signed)
Pt presents to ED via POV with c/o RLQ abdominal pain x 1 week. Pt also c/o nausea. Pt A&O x4, ambulatory without difficulty at this time.   Pt states pain is constant burning, has taken OTC ibuprofen without relief.

## 2020-01-02 NOTE — ED Notes (Signed)
Pt presents to the ED for R flank pain that radiates to her abdomen. Pt state pain has been going on for approx a week. Pain has not gotten better or worse. Pt is having nausea but denies vomiting at this time. Denies any urinary symptoms.   On assessment, pt A&Ox4 and NAD. Abdomen is soft and non-tender at this time.

## 2020-01-02 NOTE — ED Provider Notes (Signed)
[facility] Emergency Department Provider Note  Time seen: 9:42 PM  I have reviewed the triage vital signs and the nursing notes.   HISTORY  Chief Complaint Abdominal Pain   HPI Cheryl Fernandez is a 34 y.o. female with no significant past medical history presents to the emergency department for right flank pain.  According to the patient for the past 1 week she has been experiencing intermittent moderate pain in her right back radiating around to her right lower quadrant.  Patient states she saw her doctor who had lab work performed that said look normal and an x-ray that showed possible constipation.  Patient states she continues to have intermittent pain so she came to the emergency department.  Patient denies any fever.  No vomiting or diarrhea.  No dysuria.  Last menstrual period ended approximately 5 days ago.   History reviewed. No pertinent past medical history.  Patient Active Problem List   Diagnosis Date Noted  . Previous cesarean delivery, antepartum 11/30/2017  . Premature rupture of membranes (PROM) affecting third pregnancy 11/30/2017  . First trimester screening   . Liveborn by C-section 06/30/2015  . History of cesarean section, low transverse 06/29/2015    Past Surgical History:  Procedure Laterality Date  . CESAREAN SECTION    . CESAREAN SECTION N/A 06/29/2015   Procedure: CESAREAN SECTION;  Surgeon: Ala Dach, MD;  Location: [facility] ORS;  Service: Obstetrics;  Laterality: N/A;  . CESAREAN SECTION N/A 11/23/2013  . CESAREAN SECTION  06/29/2015  . CESAREAN SECTION WITH BILATERAL TUBAL LIGATION Bilateral 11/30/2017   Procedure: CESAREAN SECTION WITH BILATERAL TUBAL LIGATION;  Surgeon: Christeen Douglas, MD;  Location: [facility] ORS;  Service: Obstetrics;  Laterality: Bilateral;  . DILATION AND CURETTAGE OF UTERUS      Prior to Admission medications   Medication Sig Start Date End Date Taking? Authorizing Provider  ibuprofen  (ADVIL,MOTRIN) 600 MG tablet Take 1 tablet (600 mg total) by mouth every 6 (six) hours. 12/02/17   Ward, Elenora Fender, MD  Prenatal Vit-Fe Fumarate-FA (PRENATAL MULTIVITAMIN) TABS tablet Take 1 tablet by mouth at bedtime.     [provider]    No Known Allergies  Family History  Problem Relation Age of Onset  . Hypertension Mother   . Diabetes Mother   . Hypertension Father   . Diabetes Father     Social History Social History   Tobacco Use  . Smoking status: Never Smoker  . Smokeless tobacco: Never Used  Substance Use Topics  . Alcohol use: No  . Drug use: No    Review of Systems Constitutional: Negative for fever. Cardiovascular: Negative for chest pain. Respiratory: Negative for shortness of breath. Gastrointestinal: Right flank pain.  Negative for vomiting or diarrhea. Genitourinary: Negative for urinary compaints Musculoskeletal: Negative for musculoskeletal complaints Neurological: Negative for headache All other ROS negative  ____________________________________________   PHYSICAL EXAM:  VITAL SIGNS: ED Triage Vitals  Enc Vitals Group     BP 01/02/20 1732 126/67     Pulse Rate 01/02/20 1732 74     Resp 01/02/20 1732 20     Temp 01/02/20 1732 98.6 F (37 C)     Temp Source 01/02/20 1732 Oral     SpO2 01/02/20 1732 100 %     Weight 01/02/20 1733 181 lb (82.1 kg)     Height 01/02/20 1733 5\' 8"  (1.727 m)     Head Circumference --      Peak Flow --  Pain Score 01/02/20 1733 8     Pain Loc --      Pain Edu? --      Excl. in [place]? --     Constitutional: Alert and oriented. Well appearing and in no distress. Eyes: Normal exam ENT      Head: Normocephalic and atraumatic.      Mouth/Throat: Mucous membranes are moist. Cardiovascular: Normal rate, regular rhythm.  Respiratory: Normal respiratory effort without tachypnea nor retractions. Breath sounds are clear  Gastrointestinal: Soft, slight right lower quadrant tenderness palpation.  No rebound  guarding or distention. Musculoskeletal: Nontender with normal range of motion in all extremities.  Neurologic:  Normal speech and language. No gross focal neurologic deficits  Skin:  Skin is warm, dry and intact.  Psychiatric: Mood and affect are normal.   ____________________________________________    RADIOLOGY  CT scan is negative for acute abnormality.  ____________________________________________   INITIAL IMPRESSION / ASSESSMENT AND PLAN / ED COURSE  Pertinent labs & imaging results that were available during my care of the patient were reviewed by me and considered in my medical decision making (see chart for details).   Patient presents emergency department for 1 week of intermittent right flank pain.  Differential would include ureterolithiasis, UTI or pyelonephritis, appendicitis, constipation.  Patient's labs are largely within normal limits there is a slight amount of blood on the dipstick but negative for blood under microscopy.  Given the intermittent pain over the past 1 week we will obtain CT imaging to rule out ureterolithiasis and hopefully evaluate appendix as well.  CT scan largely negative for acute abnormality.  Normal-appearing appendix.  Unremarkable reproductive system.  Patient states she has had similar pains in the past with a ovarian cyst.  Patient could have possibly had a ruptured or hemorrhagic cyst although no significant abnormality currently on CT.  We will discharge patient home have her follow-up with her doctor.  We will discharge with a short course of Bentyl to see if this helps with her symptoms.  Patient agreeable to plan of care.  Cheryl Fernandez was evaluated in Emergency Department on 01/02/2020 for the symptoms described in the history of present illness. She was evaluated in the context of the global COVID-19 pandemic, which necessitated consideration that the patient might be at risk for infection with the SARS-CoV-2 virus that causes  COVID-19. Institutional protocols and algorithms that pertain to the evaluation of patients at risk for COVID-19 are in a state of rapid change based on information released by regulatory bodies including the CDC and federal and state organizations. These policies and algorithms were followed during the patient's care in the ED.  ____________________________________________   FINAL CLINICAL IMPRESSION(S) / ED DIAGNOSES  Right flank pain   Harvest Dark, MD 01/02/20 2229

## 2022-09-06 ENCOUNTER — Other Ambulatory Visit: Payer: Self-pay

## 2022-09-06 ENCOUNTER — Emergency Department
Admission: EM | Admit: 2022-09-06 | Discharge: 2022-09-06 | Disposition: A | Discharge status: Home / Self Care | Payer: Medicaid Other | Attending: Student in an Organized Health Care Education/Training Program | Admitting: Student in an Organized Health Care Education/Training Program

## 2022-09-06 DIAGNOSIS — J040 Acute laryngitis: Secondary | ICD-10-CM | POA: Diagnosis not present

## 2022-09-06 DIAGNOSIS — J029 Acute pharyngitis, unspecified: Secondary | ICD-10-CM | POA: Diagnosis present

## 2022-09-06 DIAGNOSIS — Z20822 Contact with and (suspected) exposure to covid-19: Secondary | ICD-10-CM | POA: Insufficient documentation

## 2022-09-06 DIAGNOSIS — B9789 Other viral agents as the cause of diseases classified elsewhere: Secondary | ICD-10-CM

## 2022-09-06 LAB — GROUP A STREP BY PCR: Group A Strep by PCR: NOT DETECTED

## 2022-09-06 LAB — RESP PANEL BY RT-PCR (RSV, FLU A&B, COVID)  RVPGX2
Influenza A by PCR: NEGATIVE
Influenza B by PCR: NEGATIVE
Resp Syncytial Virus by PCR: POSITIVE — AB
SARS Coronavirus 2 by RT PCR: NEGATIVE

## 2022-09-06 MED ORDER — BENZONATATE 100 MG PO CAPS: 100.0000 mg | ORAL_CAPSULE | Freq: Three times a day (TID) | ORAL | 0 refills | Status: AC | PRN

## 2022-09-06 NOTE — Discharge Instructions (Addendum)
You can take Tessalon Perles up to three times daily for cough.  You can use 1 spray of Flonase each side for nasal congestion.

## 2022-09-06 NOTE — ED Triage Notes (Signed)
Patient here with sore throat the began yesterday; She no longer has pain but is now hoarse

## 2022-09-06 NOTE — ED Provider Notes (Signed)
[facility] Provider Note  Patient Contact: 7:35 PM (approximate)   History   Sore Throat (Patient here with sore throat the began yesterday; She no longer has pain but is now hoarse)   HPI  Cheryl Fernandez is a 37 y.o. female presents to the emergency department with pharyngitis, laryngitis, rhinorrhea, nasal congestion and sporadic cough for the past 3 to 4 days.  No vomiting or diarrhea.  No chest pain, chest tightness or shortness of breath.  No abdominal pain.  Patient's son is sick at home with similar symptoms.      Physical Exam   Triage Vital Signs: ED Triage Vitals  Enc Vitals Group     BP 09/06/22 1754 130/82     Pulse Rate 09/06/22 1754 80     Resp 09/06/22 1754 16     Temp 09/06/22 1754 99.4 F (37.4 C)     Temp src --      SpO2 09/06/22 1754 100 %     Weight --      Height --      Head Circumference --      Peak Flow --      Pain Score 09/06/22 1756 0     Pain Loc --      Pain Edu? --      Excl. in [place]? --     Most recent vital signs: Vitals:   09/06/22 1754  BP: 130/82  Pulse: 80  Resp: 16  Temp: 99.4 F (37.4 C)  SpO2: 100%    Constitutional: Alert and oriented. Patient is lying supine. Eyes: Conjunctivae are normal. PERRL. EOMI. Head: Atraumatic. ENT:      Ears: Tympanic membranes are mildly injected with mild effusion bilaterally.       Nose: No congestion/rhinnorhea.      Mouth/Throat: Mucous membranes are moist. Posterior pharynx is mildly erythematous.  Hematological/Lymphatic/Immunilogical: No cervical lymphadenopathy.  Cardiovascular: Normal rate, regular rhythm. Normal S1 and S2.  Good peripheral circulation. Respiratory: Normal respiratory effort without tachypnea or retractions. Lungs CTAB. Good air entry to the bases with no decreased or absent breath sounds. Gastrointestinal: Bowel sounds 4 quadrants. Soft and nontender to palpation. No guarding or rigidity. No palpable masses. No distention. No  CVA tenderness. Musculoskeletal: Full range of motion to all extremities. No gross deformities appreciated. Neurologic:  Normal speech and language. No gross focal neurologic deficits are appreciated.  Skin:  Skin is warm, dry and intact. No rash noted. Psychiatric: Mood and affect are normal. Speech and behavior are normal. Patient exhibits appropriate insight and judgement.   ED Results / Procedures / Treatments   Labs (all labs ordered are listed, but only abnormal results are displayed) Labs Reviewed  GROUP A STREP BY PCR  RESP PANEL BY RT-PCR (RSV, FLU A&B, COVID)  [id number]        PROCEDURES:  Critical Care performed: No  Procedures   MEDICATIONS ORDERED IN ED: Medications - No data to display   IMPRESSION / MDM / [place] / ED COURSE  I reviewed the triage vital signs and the nursing notes.                              Assessment and plan Laryngitis 37 year old female presents to the emergency department with pharyngitis with viral URI-like symptoms.  Vital signs are reassuring at triage.  On exam, patient was alert, active and nontoxic-appearing.  Group A strep testing  was negative.  Suspect unspecified viral infection.  Supportive measures were encouraged at home.  Return precautions were given to return with new or worsening symptoms.     FINAL CLINICAL IMPRESSION(S) / ED DIAGNOSES   Final diagnoses:  Viral laryngitis     Rx / DC Orders   ED Discharge Orders          Ordered    benzonatate (TESSALON PERLES) 100 MG capsule  3 times daily PRN        09/06/22 1925             Note:  This document was prepared using Dragon voice recognition software and may include unintentional dictation errors.   Vallarie Mare [place], [place] 09/06/22 Lattie Corns    Merlyn Lot, MD 09/06/22 2013

## 2023-05-21 ENCOUNTER — Other Ambulatory Visit: Payer: Self-pay

## 2023-05-21 ENCOUNTER — Emergency Department: Payer: No Typology Code available for payment source

## 2023-05-21 ENCOUNTER — Encounter: Payer: Self-pay | Admitting: Emergency Medicine

## 2023-05-21 DIAGNOSIS — S20311A Abrasion of right front wall of thorax, initial encounter: Secondary | ICD-10-CM | POA: Diagnosis not present

## 2023-05-21 DIAGNOSIS — Y92410 Unspecified street and highway as the place of occurrence of the external cause: Secondary | ICD-10-CM | POA: Diagnosis not present

## 2023-05-21 DIAGNOSIS — S7002XA Contusion of left hip, initial encounter: Secondary | ICD-10-CM | POA: Insufficient documentation

## 2023-05-21 DIAGNOSIS — M25551 Pain in right hip: Secondary | ICD-10-CM | POA: Insufficient documentation

## 2023-05-21 DIAGNOSIS — S79912A Unspecified injury of left hip, initial encounter: Secondary | ICD-10-CM | POA: Diagnosis present

## 2023-05-21 LAB — POC URINE PREG, ED: Preg Test, Ur: NEGATIVE
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
## 2023-05-22 ENCOUNTER — Emergency Department
Admission: EM | Admit: 2023-05-22 | Discharge: 2023-05-22 | Disposition: A | Discharge status: Home / Self Care | Payer: No Typology Code available for payment source | Attending: Emergency Medicine | Admitting: Emergency Medicine

## 2023-05-22 DIAGNOSIS — S7002XA Contusion of left hip, initial encounter: Secondary | ICD-10-CM

## 2023-05-22 DIAGNOSIS — S20311A Abrasion of right front wall of thorax, initial encounter: Secondary | ICD-10-CM

## 2023-05-22 MED ORDER — IBUPROFEN 800 MG PO TABS
800.0000 mg | ORAL_TABLET | Freq: Once | ORAL | Status: AC
  Administered 2023-05-22: 800 mg via ORAL
  Filled 2023-05-22: qty 1
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
## 2023-05-22 NOTE — Discharge Instructions (Addendum)
You may take Tylenol and/or ibuprofen as needed for pain.  Return to the ER for worsening symptoms, persistent vomiting, difficulty breathing or other concerns.

## 2023-05-22 NOTE — ED Notes (Signed)
Patient discharged at this time. Ambulated to lobby with independent and steady gait. Breathing unlabored speaking in full sentences. Verbalized understanding of all discharge, follow up, and medication teaching. Discharged homed with all belongings.
